# Patient Record
Sex: Male | Born: 1970 | Race: White | Hispanic: No | Marital: Married | Smoking: Never smoker
Health system: Southern US, Community
[De-identification: ages and names within clinical notes are randomized; demographics above are authoritative.]

## PROBLEM LIST (undated history)

## (undated) DIAGNOSIS — K222 Esophageal obstruction: Secondary | ICD-10-CM

## (undated) DIAGNOSIS — K219 Gastro-esophageal reflux disease without esophagitis: Secondary | ICD-10-CM

## (undated) HISTORY — DX: Esophageal obstruction: K22.2

## (undated) HISTORY — PX: WISDOM TOOTH EXTRACTION: SHX21

## (undated) HISTORY — DX: Gastro-esophageal reflux disease without esophagitis: K21.9

---

## 2004-11-15 ENCOUNTER — Ambulatory Visit: Payer: Self-pay | Admitting: Internal Medicine

## 2004-11-24 ENCOUNTER — Ambulatory Visit: Payer: Self-pay | Admitting: Internal Medicine

## 2004-12-06 ENCOUNTER — Ambulatory Visit: Payer: Self-pay | Admitting: Internal Medicine

## 2004-12-21 ENCOUNTER — Ambulatory Visit: Payer: Self-pay | Admitting: Internal Medicine

## 2005-01-02 ENCOUNTER — Ambulatory Visit: Payer: Self-pay | Admitting: Internal Medicine

## 2005-01-12 ENCOUNTER — Ambulatory Visit: Payer: Self-pay | Admitting: Internal Medicine

## 2005-01-18 ENCOUNTER — Ambulatory Visit: Payer: Self-pay | Admitting: Internal Medicine

## 2005-01-19 ENCOUNTER — Ambulatory Visit: Payer: Self-pay | Admitting: Internal Medicine

## 2005-01-26 ENCOUNTER — Ambulatory Visit: Payer: Self-pay | Admitting: Internal Medicine

## 2005-02-02 ENCOUNTER — Ambulatory Visit: Payer: Self-pay | Admitting: Internal Medicine

## 2005-02-07 ENCOUNTER — Ambulatory Visit: Payer: Self-pay | Admitting: Internal Medicine

## 2005-02-14 ENCOUNTER — Ambulatory Visit: Payer: Self-pay | Admitting: Internal Medicine

## 2005-02-23 ENCOUNTER — Ambulatory Visit: Payer: Self-pay | Admitting: Internal Medicine

## 2005-03-01 ENCOUNTER — Ambulatory Visit: Payer: Self-pay | Admitting: Internal Medicine

## 2005-03-07 ENCOUNTER — Ambulatory Visit: Payer: Self-pay | Admitting: Internal Medicine

## 2005-03-21 ENCOUNTER — Ambulatory Visit: Payer: Self-pay | Admitting: Internal Medicine

## 2005-03-27 ENCOUNTER — Ambulatory Visit: Payer: Self-pay | Admitting: Internal Medicine

## 2005-04-06 ENCOUNTER — Ambulatory Visit: Payer: Self-pay | Admitting: Internal Medicine

## 2005-04-20 ENCOUNTER — Ambulatory Visit: Payer: Self-pay | Admitting: Internal Medicine

## 2005-05-02 ENCOUNTER — Ambulatory Visit: Payer: Self-pay | Admitting: Internal Medicine

## 2005-05-16 ENCOUNTER — Ambulatory Visit: Payer: Self-pay | Admitting: Internal Medicine

## 2005-05-24 ENCOUNTER — Ambulatory Visit: Payer: Self-pay | Admitting: Internal Medicine

## 2005-06-05 ENCOUNTER — Ambulatory Visit: Payer: Self-pay | Admitting: Internal Medicine

## 2005-06-19 ENCOUNTER — Ambulatory Visit: Payer: Self-pay | Admitting: Internal Medicine

## 2005-07-06 ENCOUNTER — Ambulatory Visit: Payer: Self-pay | Admitting: Internal Medicine

## 2005-07-19 ENCOUNTER — Ambulatory Visit: Payer: Self-pay | Admitting: Internal Medicine

## 2005-07-25 ENCOUNTER — Ambulatory Visit: Payer: Self-pay | Admitting: Internal Medicine

## 2005-08-03 ENCOUNTER — Ambulatory Visit: Payer: Self-pay | Admitting: Internal Medicine

## 2005-08-08 ENCOUNTER — Ambulatory Visit: Payer: Self-pay | Admitting: Internal Medicine

## 2005-08-13 ENCOUNTER — Ambulatory Visit: Payer: Self-pay | Admitting: Internal Medicine

## 2005-08-16 ENCOUNTER — Ambulatory Visit: Payer: Self-pay | Admitting: Internal Medicine

## 2005-08-22 ENCOUNTER — Ambulatory Visit: Payer: Self-pay | Admitting: Internal Medicine

## 2005-09-07 ENCOUNTER — Ambulatory Visit: Payer: Self-pay | Admitting: Internal Medicine

## 2005-10-03 ENCOUNTER — Ambulatory Visit: Payer: Self-pay | Admitting: Internal Medicine

## 2005-10-19 ENCOUNTER — Ambulatory Visit: Payer: Self-pay | Admitting: Internal Medicine

## 2005-11-07 ENCOUNTER — Ambulatory Visit: Payer: Self-pay | Admitting: Internal Medicine

## 2005-11-15 ENCOUNTER — Ambulatory Visit: Payer: Self-pay | Admitting: Internal Medicine

## 2005-11-28 ENCOUNTER — Ambulatory Visit: Payer: Self-pay | Admitting: Internal Medicine

## 2005-12-12 ENCOUNTER — Ambulatory Visit: Payer: Self-pay | Admitting: Internal Medicine

## 2005-12-27 ENCOUNTER — Ambulatory Visit: Payer: Self-pay | Admitting: Internal Medicine

## 2006-01-10 ENCOUNTER — Ambulatory Visit: Payer: Self-pay | Admitting: Internal Medicine

## 2006-01-22 ENCOUNTER — Ambulatory Visit: Payer: Self-pay | Admitting: Internal Medicine

## 2006-02-05 ENCOUNTER — Ambulatory Visit: Payer: Self-pay | Admitting: Internal Medicine

## 2006-02-21 ENCOUNTER — Ambulatory Visit: Payer: Self-pay | Admitting: Internal Medicine

## 2006-03-13 ENCOUNTER — Ambulatory Visit: Payer: Self-pay | Admitting: Internal Medicine

## 2006-03-21 ENCOUNTER — Ambulatory Visit: Payer: Self-pay | Admitting: Internal Medicine

## 2006-03-28 ENCOUNTER — Ambulatory Visit: Payer: Self-pay | Admitting: Internal Medicine

## 2006-03-29 ENCOUNTER — Ambulatory Visit: Payer: Self-pay | Admitting: Internal Medicine

## 2006-04-10 ENCOUNTER — Ambulatory Visit: Payer: Self-pay | Admitting: Internal Medicine

## 2006-04-23 ENCOUNTER — Ambulatory Visit: Payer: Self-pay | Admitting: Internal Medicine

## 2006-05-03 ENCOUNTER — Ambulatory Visit: Payer: Self-pay | Admitting: Internal Medicine

## 2006-05-10 ENCOUNTER — Ambulatory Visit: Payer: Self-pay | Admitting: Internal Medicine

## 2006-05-23 ENCOUNTER — Ambulatory Visit: Payer: Self-pay | Admitting: Internal Medicine

## 2006-05-31 ENCOUNTER — Ambulatory Visit: Payer: Self-pay | Admitting: Internal Medicine

## 2006-06-07 ENCOUNTER — Ambulatory Visit: Payer: Self-pay | Admitting: Internal Medicine

## 2006-06-10 ENCOUNTER — Encounter: Admission: RE | Admit: 2006-06-10 | Discharge: 2006-06-10 | Payer: Self-pay | Admitting: Orthopedic Surgery

## 2006-06-14 ENCOUNTER — Ambulatory Visit: Payer: Self-pay | Admitting: Internal Medicine

## 2006-06-28 ENCOUNTER — Ambulatory Visit: Payer: Self-pay | Admitting: Internal Medicine

## 2006-07-05 ENCOUNTER — Ambulatory Visit: Payer: Self-pay | Admitting: Internal Medicine

## 2006-07-15 ENCOUNTER — Ambulatory Visit: Payer: Self-pay | Admitting: Internal Medicine

## 2006-07-26 ENCOUNTER — Ambulatory Visit: Payer: Self-pay | Admitting: Internal Medicine

## 2006-07-30 ENCOUNTER — Ambulatory Visit: Payer: Self-pay | Admitting: Internal Medicine

## 2006-08-27 ENCOUNTER — Ambulatory Visit: Payer: Self-pay | Admitting: Internal Medicine

## 2006-09-05 ENCOUNTER — Ambulatory Visit: Payer: Self-pay | Admitting: Internal Medicine

## 2006-09-25 ENCOUNTER — Ambulatory Visit: Payer: Self-pay | Admitting: Internal Medicine

## 2006-09-30 ENCOUNTER — Ambulatory Visit: Payer: Self-pay | Admitting: Internal Medicine

## 2006-10-25 ENCOUNTER — Ambulatory Visit: Payer: Self-pay | Admitting: Internal Medicine

## 2006-11-08 ENCOUNTER — Ambulatory Visit: Payer: Self-pay | Admitting: Internal Medicine

## 2006-11-28 ENCOUNTER — Ambulatory Visit: Payer: Self-pay | Admitting: Internal Medicine

## 2006-12-13 ENCOUNTER — Ambulatory Visit: Payer: Self-pay | Admitting: Internal Medicine

## 2006-12-30 ENCOUNTER — Ambulatory Visit: Payer: Self-pay | Admitting: Internal Medicine

## 2007-01-15 ENCOUNTER — Ambulatory Visit: Payer: Self-pay | Admitting: Internal Medicine

## 2007-01-23 ENCOUNTER — Ambulatory Visit: Payer: Self-pay | Admitting: Internal Medicine

## 2007-01-30 ENCOUNTER — Ambulatory Visit: Payer: Self-pay | Admitting: Internal Medicine

## 2007-02-07 ENCOUNTER — Ambulatory Visit: Payer: Self-pay | Admitting: Internal Medicine

## 2007-02-07 LAB — CONVERTED CEMR LAB
ALT: 20 units/L (ref 0–40)
AST: 18 units/L (ref 0–37)
Basophils Relative: 0.2 % (ref 0.0–1.0)
Bilirubin, Direct: 0.1 mg/dL (ref 0.0–0.3)
CO2: 29 meq/L (ref 19–32)
Chloride: 110 meq/L (ref 96–112)
Eosinophils Absolute: 0.3 10*3/uL (ref 0.0–0.6)
Eosinophils Relative: 4.5 % (ref 0.0–5.0)
GFR calc non Af Amer: 103 mL/min
Glucose, Bld: 87 mg/dL (ref 70–99)
HCT: 46.2 % (ref 39.0–52.0)
Lymphocytes Relative: 35.6 % (ref 12.0–46.0)
MCV: 83.7 fL (ref 78.0–100.0)
Neutrophils Relative %: 48.1 % (ref 43.0–77.0)
RBC: 5.52 M/uL (ref 4.22–5.81)
Sodium: 143 meq/L (ref 135–145)
TSH: 2.51 microintl units/mL (ref 0.35–5.50)
Total Bilirubin: 0.7 mg/dL (ref 0.3–1.2)
Total CHOL/HDL Ratio: 5.2
Total Protein: 6.2 g/dL (ref 6.0–8.3)
VLDL: 25 mg/dL (ref 0–40)
WBC: 7.2 10*3/uL (ref 4.5–10.5)

## 2007-02-11 ENCOUNTER — Ambulatory Visit: Payer: Self-pay | Admitting: Internal Medicine

## 2007-02-14 ENCOUNTER — Ambulatory Visit: Payer: Self-pay | Admitting: Internal Medicine

## 2007-02-18 ENCOUNTER — Ambulatory Visit: Payer: Self-pay | Admitting: Internal Medicine

## 2007-02-27 ENCOUNTER — Ambulatory Visit: Payer: Self-pay | Admitting: Internal Medicine

## 2007-03-05 ENCOUNTER — Ambulatory Visit: Payer: Self-pay | Admitting: Internal Medicine

## 2007-03-14 ENCOUNTER — Ambulatory Visit: Payer: Self-pay | Admitting: Internal Medicine

## 2007-03-24 ENCOUNTER — Ambulatory Visit: Payer: Self-pay | Admitting: Internal Medicine

## 2007-04-04 ENCOUNTER — Ambulatory Visit: Payer: Self-pay | Admitting: Internal Medicine

## 2007-04-11 ENCOUNTER — Ambulatory Visit: Payer: Self-pay | Admitting: Internal Medicine

## 2007-04-18 ENCOUNTER — Ambulatory Visit: Payer: Self-pay | Admitting: Internal Medicine

## 2007-04-29 ENCOUNTER — Ambulatory Visit: Payer: Self-pay | Admitting: Internal Medicine

## 2007-05-07 ENCOUNTER — Ambulatory Visit: Payer: Self-pay | Admitting: Internal Medicine

## 2007-05-12 ENCOUNTER — Ambulatory Visit: Payer: Self-pay | Admitting: Internal Medicine

## 2007-05-14 ENCOUNTER — Ambulatory Visit: Payer: Self-pay | Admitting: Internal Medicine

## 2007-05-30 ENCOUNTER — Ambulatory Visit: Payer: Self-pay | Admitting: Internal Medicine

## 2007-06-17 ENCOUNTER — Ambulatory Visit: Payer: Self-pay | Admitting: Internal Medicine

## 2007-06-26 ENCOUNTER — Ambulatory Visit: Payer: Self-pay | Admitting: Internal Medicine

## 2007-07-03 DIAGNOSIS — J309 Allergic rhinitis, unspecified: Secondary | ICD-10-CM | POA: Insufficient documentation

## 2007-07-10 ENCOUNTER — Ambulatory Visit: Payer: Self-pay | Admitting: Internal Medicine

## 2007-07-18 ENCOUNTER — Ambulatory Visit: Payer: Self-pay | Admitting: Internal Medicine

## 2007-07-28 ENCOUNTER — Ambulatory Visit: Payer: Self-pay | Admitting: Internal Medicine

## 2007-08-05 ENCOUNTER — Ambulatory Visit: Payer: Self-pay | Admitting: Internal Medicine

## 2007-08-13 ENCOUNTER — Ambulatory Visit: Payer: Self-pay | Admitting: Internal Medicine

## 2007-08-26 ENCOUNTER — Ambulatory Visit: Payer: Self-pay | Admitting: Internal Medicine

## 2007-09-03 ENCOUNTER — Ambulatory Visit: Payer: Self-pay | Admitting: Internal Medicine

## 2007-09-12 ENCOUNTER — Ambulatory Visit: Payer: Self-pay | Admitting: Internal Medicine

## 2007-09-24 ENCOUNTER — Ambulatory Visit: Payer: Self-pay | Admitting: Internal Medicine

## 2007-09-25 ENCOUNTER — Ambulatory Visit: Payer: Self-pay | Admitting: Internal Medicine

## 2007-10-10 ENCOUNTER — Ambulatory Visit: Payer: Self-pay | Admitting: Internal Medicine

## 2007-10-28 ENCOUNTER — Ambulatory Visit: Payer: Self-pay | Admitting: Internal Medicine

## 2007-11-10 ENCOUNTER — Ambulatory Visit: Payer: Self-pay | Admitting: Internal Medicine

## 2007-11-28 ENCOUNTER — Ambulatory Visit: Payer: Self-pay | Admitting: Internal Medicine

## 2007-12-12 ENCOUNTER — Ambulatory Visit: Payer: Self-pay | Admitting: Internal Medicine

## 2007-12-22 ENCOUNTER — Ambulatory Visit: Payer: Self-pay | Admitting: Internal Medicine

## 2007-12-30 ENCOUNTER — Ambulatory Visit: Payer: Self-pay | Admitting: Internal Medicine

## 2008-01-06 ENCOUNTER — Ambulatory Visit: Payer: Self-pay | Admitting: Gastroenterology

## 2008-01-06 ENCOUNTER — Ambulatory Visit: Payer: Self-pay | Admitting: Internal Medicine

## 2008-01-15 ENCOUNTER — Ambulatory Visit: Payer: Self-pay | Admitting: Internal Medicine

## 2008-01-22 ENCOUNTER — Encounter: Payer: Self-pay | Admitting: Gastroenterology

## 2008-01-22 ENCOUNTER — Ambulatory Visit: Payer: Self-pay | Admitting: Gastroenterology

## 2008-01-23 ENCOUNTER — Encounter: Payer: Self-pay | Admitting: Internal Medicine

## 2008-01-23 ENCOUNTER — Ambulatory Visit: Payer: Self-pay | Admitting: Internal Medicine

## 2008-01-27 ENCOUNTER — Ambulatory Visit: Payer: Self-pay | Admitting: Internal Medicine

## 2008-02-06 ENCOUNTER — Ambulatory Visit: Payer: Self-pay | Admitting: Internal Medicine

## 2008-02-12 ENCOUNTER — Ambulatory Visit: Payer: Self-pay | Admitting: Internal Medicine

## 2008-02-23 ENCOUNTER — Ambulatory Visit: Payer: Self-pay | Admitting: Internal Medicine

## 2008-03-01 ENCOUNTER — Ambulatory Visit: Payer: Self-pay | Admitting: Gastroenterology

## 2008-03-03 ENCOUNTER — Ambulatory Visit: Payer: Self-pay | Admitting: Internal Medicine

## 2008-03-04 ENCOUNTER — Ambulatory Visit: Payer: Self-pay | Admitting: Internal Medicine

## 2008-03-09 ENCOUNTER — Ambulatory Visit: Payer: Self-pay | Admitting: Internal Medicine

## 2008-03-17 ENCOUNTER — Ambulatory Visit: Payer: Self-pay | Admitting: Internal Medicine

## 2008-04-01 ENCOUNTER — Ambulatory Visit: Payer: Self-pay | Admitting: Internal Medicine

## 2008-04-07 ENCOUNTER — Ambulatory Visit: Payer: Self-pay | Admitting: Internal Medicine

## 2008-04-19 ENCOUNTER — Ambulatory Visit: Payer: Self-pay | Admitting: Internal Medicine

## 2008-04-28 ENCOUNTER — Ambulatory Visit: Payer: Self-pay | Admitting: Internal Medicine

## 2008-05-06 ENCOUNTER — Ambulatory Visit: Payer: Self-pay | Admitting: Internal Medicine

## 2008-05-19 ENCOUNTER — Ambulatory Visit: Payer: Self-pay | Admitting: Internal Medicine

## 2008-05-31 ENCOUNTER — Ambulatory Visit: Payer: Self-pay | Admitting: Internal Medicine

## 2008-06-10 ENCOUNTER — Ambulatory Visit: Payer: Self-pay | Admitting: Internal Medicine

## 2008-06-29 ENCOUNTER — Ambulatory Visit: Payer: Self-pay | Admitting: Internal Medicine

## 2008-07-09 ENCOUNTER — Ambulatory Visit: Payer: Self-pay | Admitting: Internal Medicine

## 2008-07-20 ENCOUNTER — Ambulatory Visit: Payer: Self-pay | Admitting: Internal Medicine

## 2008-08-05 ENCOUNTER — Ambulatory Visit: Payer: Self-pay | Admitting: Internal Medicine

## 2008-08-20 ENCOUNTER — Ambulatory Visit: Payer: Self-pay | Admitting: Internal Medicine

## 2008-08-24 ENCOUNTER — Ambulatory Visit: Payer: Self-pay | Admitting: Internal Medicine

## 2008-08-24 ENCOUNTER — Ambulatory Visit: Payer: Self-pay | Admitting: Pulmonary Disease

## 2008-08-27 ENCOUNTER — Ambulatory Visit: Payer: Self-pay | Admitting: Internal Medicine

## 2008-09-02 ENCOUNTER — Ambulatory Visit: Payer: Self-pay | Admitting: Gastroenterology

## 2008-09-02 ENCOUNTER — Ambulatory Visit: Payer: Self-pay | Admitting: Internal Medicine

## 2008-09-02 DIAGNOSIS — R1319 Other dysphagia: Secondary | ICD-10-CM | POA: Insufficient documentation

## 2008-09-02 DIAGNOSIS — K219 Gastro-esophageal reflux disease without esophagitis: Secondary | ICD-10-CM | POA: Insufficient documentation

## 2008-09-10 ENCOUNTER — Ambulatory Visit: Payer: Self-pay | Admitting: Internal Medicine

## 2008-09-17 ENCOUNTER — Ambulatory Visit: Payer: Self-pay | Admitting: Internal Medicine

## 2008-09-28 ENCOUNTER — Ambulatory Visit: Payer: Self-pay | Admitting: Internal Medicine

## 2008-10-05 ENCOUNTER — Ambulatory Visit: Payer: Self-pay | Admitting: Gastroenterology

## 2008-10-05 ENCOUNTER — Ambulatory Visit: Payer: Self-pay | Admitting: Internal Medicine

## 2008-10-08 ENCOUNTER — Ambulatory Visit: Payer: Self-pay | Admitting: Internal Medicine

## 2008-10-12 ENCOUNTER — Ambulatory Visit: Payer: Self-pay | Admitting: Gastroenterology

## 2008-11-03 ENCOUNTER — Ambulatory Visit: Payer: Self-pay | Admitting: Internal Medicine

## 2008-11-09 ENCOUNTER — Ambulatory Visit: Payer: Self-pay | Admitting: Internal Medicine

## 2008-11-29 ENCOUNTER — Ambulatory Visit: Payer: Self-pay | Admitting: Internal Medicine

## 2008-12-09 ENCOUNTER — Ambulatory Visit: Payer: Self-pay | Admitting: Internal Medicine

## 2008-12-22 ENCOUNTER — Ambulatory Visit: Payer: Self-pay | Admitting: Internal Medicine

## 2008-12-31 ENCOUNTER — Ambulatory Visit: Payer: Self-pay | Admitting: Internal Medicine

## 2009-01-07 ENCOUNTER — Ambulatory Visit: Payer: Self-pay | Admitting: Internal Medicine

## 2009-01-14 ENCOUNTER — Ambulatory Visit: Payer: Self-pay | Admitting: Internal Medicine

## 2009-01-21 ENCOUNTER — Ambulatory Visit: Payer: Self-pay | Admitting: Internal Medicine

## 2009-02-03 ENCOUNTER — Ambulatory Visit: Payer: Self-pay | Admitting: Internal Medicine

## 2009-02-11 ENCOUNTER — Ambulatory Visit: Payer: Self-pay | Admitting: Internal Medicine

## 2009-02-18 ENCOUNTER — Ambulatory Visit: Payer: Self-pay | Admitting: Internal Medicine

## 2009-03-09 ENCOUNTER — Encounter: Payer: Self-pay | Admitting: Internal Medicine

## 2009-04-22 ENCOUNTER — Telehealth (INDEPENDENT_AMBULATORY_CARE_PROVIDER_SITE_OTHER): Payer: Self-pay | Admitting: *Deleted

## 2009-04-25 ENCOUNTER — Ambulatory Visit: Payer: Self-pay | Admitting: Internal Medicine

## 2009-05-02 ENCOUNTER — Ambulatory Visit: Payer: Self-pay | Admitting: Internal Medicine

## 2009-05-03 ENCOUNTER — Telehealth: Payer: Self-pay | Admitting: Internal Medicine

## 2009-05-03 ENCOUNTER — Ambulatory Visit: Payer: Self-pay | Admitting: Internal Medicine

## 2009-05-31 ENCOUNTER — Ambulatory Visit: Payer: Self-pay | Admitting: Internal Medicine

## 2009-06-03 ENCOUNTER — Ambulatory Visit: Payer: Self-pay | Admitting: Internal Medicine

## 2009-06-07 ENCOUNTER — Ambulatory Visit: Payer: Self-pay | Admitting: Internal Medicine

## 2009-06-10 ENCOUNTER — Ambulatory Visit: Payer: Self-pay | Admitting: Internal Medicine

## 2009-06-14 ENCOUNTER — Ambulatory Visit: Payer: Self-pay | Admitting: Internal Medicine

## 2009-06-17 ENCOUNTER — Ambulatory Visit: Payer: Self-pay | Admitting: Internal Medicine

## 2009-06-21 ENCOUNTER — Ambulatory Visit: Payer: Self-pay | Admitting: Internal Medicine

## 2009-06-27 ENCOUNTER — Ambulatory Visit: Payer: Self-pay | Admitting: Internal Medicine

## 2009-06-30 ENCOUNTER — Ambulatory Visit: Payer: Self-pay | Admitting: Internal Medicine

## 2009-07-01 ENCOUNTER — Ambulatory Visit: Payer: Self-pay | Admitting: Internal Medicine

## 2009-07-05 ENCOUNTER — Ambulatory Visit: Payer: Self-pay | Admitting: Internal Medicine

## 2009-07-08 ENCOUNTER — Ambulatory Visit: Payer: Self-pay | Admitting: Internal Medicine

## 2009-07-12 ENCOUNTER — Ambulatory Visit: Payer: Self-pay | Admitting: Internal Medicine

## 2009-07-15 ENCOUNTER — Ambulatory Visit: Payer: Self-pay | Admitting: Internal Medicine

## 2009-07-19 ENCOUNTER — Ambulatory Visit: Payer: Self-pay | Admitting: Internal Medicine

## 2009-07-22 ENCOUNTER — Ambulatory Visit: Payer: Self-pay | Admitting: Internal Medicine

## 2009-07-26 ENCOUNTER — Ambulatory Visit: Payer: Self-pay | Admitting: Internal Medicine

## 2009-08-01 ENCOUNTER — Ambulatory Visit: Payer: Self-pay | Admitting: Internal Medicine

## 2009-08-01 ENCOUNTER — Encounter (INDEPENDENT_AMBULATORY_CARE_PROVIDER_SITE_OTHER): Payer: Self-pay | Admitting: *Deleted

## 2009-08-04 ENCOUNTER — Ambulatory Visit: Payer: Self-pay | Admitting: Internal Medicine

## 2009-08-05 ENCOUNTER — Ambulatory Visit: Payer: Self-pay | Admitting: Internal Medicine

## 2009-08-09 ENCOUNTER — Ambulatory Visit: Payer: Self-pay | Admitting: Internal Medicine

## 2009-08-11 ENCOUNTER — Ambulatory Visit: Payer: Self-pay | Admitting: Internal Medicine

## 2009-08-15 ENCOUNTER — Ambulatory Visit: Payer: Self-pay | Admitting: Internal Medicine

## 2009-08-19 ENCOUNTER — Ambulatory Visit: Payer: Self-pay | Admitting: Internal Medicine

## 2009-08-23 ENCOUNTER — Ambulatory Visit: Payer: Self-pay | Admitting: Internal Medicine

## 2009-08-31 ENCOUNTER — Ambulatory Visit: Payer: Self-pay | Admitting: Internal Medicine

## 2009-09-02 ENCOUNTER — Ambulatory Visit: Payer: Self-pay | Admitting: Internal Medicine

## 2009-09-07 ENCOUNTER — Ambulatory Visit: Payer: Self-pay | Admitting: Internal Medicine

## 2009-09-09 ENCOUNTER — Ambulatory Visit: Payer: Self-pay | Admitting: Internal Medicine

## 2009-09-13 ENCOUNTER — Ambulatory Visit: Payer: Self-pay | Admitting: Internal Medicine

## 2009-09-20 ENCOUNTER — Ambulatory Visit: Payer: Self-pay | Admitting: Internal Medicine

## 2009-09-21 ENCOUNTER — Ambulatory Visit: Payer: Self-pay | Admitting: Internal Medicine

## 2009-09-22 ENCOUNTER — Ambulatory Visit: Payer: Self-pay | Admitting: Internal Medicine

## 2009-10-04 ENCOUNTER — Ambulatory Visit: Payer: Self-pay | Admitting: Internal Medicine

## 2009-10-10 ENCOUNTER — Ambulatory Visit: Payer: Self-pay | Admitting: Internal Medicine

## 2009-10-14 ENCOUNTER — Ambulatory Visit: Payer: Self-pay | Admitting: Internal Medicine

## 2009-10-19 ENCOUNTER — Ambulatory Visit: Payer: Self-pay | Admitting: Internal Medicine

## 2009-10-27 ENCOUNTER — Ambulatory Visit: Payer: Self-pay | Admitting: Internal Medicine

## 2009-11-01 ENCOUNTER — Ambulatory Visit: Payer: Self-pay | Admitting: Internal Medicine

## 2009-11-09 ENCOUNTER — Ambulatory Visit: Payer: Self-pay | Admitting: Internal Medicine

## 2009-11-17 ENCOUNTER — Ambulatory Visit: Payer: Self-pay | Admitting: Internal Medicine

## 2009-11-30 ENCOUNTER — Ambulatory Visit: Payer: Self-pay | Admitting: Internal Medicine

## 2009-12-08 ENCOUNTER — Ambulatory Visit: Payer: Self-pay | Admitting: Internal Medicine

## 2009-12-14 ENCOUNTER — Ambulatory Visit: Payer: Self-pay | Admitting: Internal Medicine

## 2009-12-22 ENCOUNTER — Ambulatory Visit: Payer: Self-pay | Admitting: Internal Medicine

## 2009-12-29 ENCOUNTER — Ambulatory Visit: Payer: Self-pay | Admitting: Internal Medicine

## 2010-01-05 ENCOUNTER — Ambulatory Visit: Payer: Self-pay | Admitting: Internal Medicine

## 2010-01-12 ENCOUNTER — Ambulatory Visit: Payer: Self-pay | Admitting: Internal Medicine

## 2010-01-12 ENCOUNTER — Telehealth (INDEPENDENT_AMBULATORY_CARE_PROVIDER_SITE_OTHER): Payer: Self-pay | Admitting: *Deleted

## 2010-01-13 ENCOUNTER — Ambulatory Visit: Payer: Self-pay | Admitting: Internal Medicine

## 2010-01-18 ENCOUNTER — Ambulatory Visit: Payer: Self-pay | Admitting: Internal Medicine

## 2010-01-26 ENCOUNTER — Ambulatory Visit: Payer: Self-pay | Admitting: Internal Medicine

## 2010-02-02 ENCOUNTER — Ambulatory Visit: Payer: Self-pay | Admitting: Internal Medicine

## 2010-02-03 ENCOUNTER — Ambulatory Visit: Payer: Self-pay | Admitting: Internal Medicine

## 2010-02-09 ENCOUNTER — Ambulatory Visit: Payer: Self-pay | Admitting: Internal Medicine

## 2010-02-17 ENCOUNTER — Ambulatory Visit: Payer: Self-pay | Admitting: Internal Medicine

## 2010-02-22 ENCOUNTER — Ambulatory Visit: Payer: Self-pay | Admitting: Internal Medicine

## 2010-03-01 ENCOUNTER — Ambulatory Visit: Payer: Self-pay | Admitting: Internal Medicine

## 2010-03-10 ENCOUNTER — Ambulatory Visit: Payer: Self-pay | Admitting: Internal Medicine

## 2010-03-15 ENCOUNTER — Ambulatory Visit: Payer: Self-pay | Admitting: Internal Medicine

## 2010-03-24 ENCOUNTER — Ambulatory Visit: Payer: Self-pay | Admitting: Internal Medicine

## 2010-03-30 ENCOUNTER — Ambulatory Visit: Payer: Self-pay | Admitting: Internal Medicine

## 2010-04-06 ENCOUNTER — Ambulatory Visit: Payer: Self-pay | Admitting: Internal Medicine

## 2010-04-12 ENCOUNTER — Ambulatory Visit: Payer: Self-pay | Admitting: Internal Medicine

## 2010-04-19 ENCOUNTER — Ambulatory Visit: Payer: Self-pay | Admitting: Internal Medicine

## 2010-05-03 ENCOUNTER — Telehealth (INDEPENDENT_AMBULATORY_CARE_PROVIDER_SITE_OTHER): Payer: Self-pay | Admitting: *Deleted

## 2010-05-04 ENCOUNTER — Ambulatory Visit: Payer: Self-pay | Admitting: Internal Medicine

## 2010-05-19 ENCOUNTER — Ambulatory Visit: Payer: Self-pay | Admitting: Internal Medicine

## 2010-05-31 ENCOUNTER — Ambulatory Visit: Payer: Self-pay | Admitting: Internal Medicine

## 2010-06-14 ENCOUNTER — Ambulatory Visit: Payer: Self-pay | Admitting: Internal Medicine

## 2010-06-23 ENCOUNTER — Ambulatory Visit: Payer: Self-pay | Admitting: Internal Medicine

## 2010-06-30 ENCOUNTER — Ambulatory Visit: Payer: Self-pay | Admitting: Internal Medicine

## 2010-07-03 ENCOUNTER — Ambulatory Visit: Payer: Self-pay | Admitting: Internal Medicine

## 2010-07-07 ENCOUNTER — Ambulatory Visit: Payer: Self-pay | Admitting: Internal Medicine

## 2010-07-19 ENCOUNTER — Ambulatory Visit: Payer: Self-pay | Admitting: Internal Medicine

## 2010-07-28 ENCOUNTER — Ambulatory Visit: Payer: Self-pay | Admitting: Internal Medicine

## 2010-08-03 ENCOUNTER — Ambulatory Visit: Payer: Self-pay | Admitting: Internal Medicine

## 2010-08-10 ENCOUNTER — Ambulatory Visit: Payer: Self-pay | Admitting: Internal Medicine

## 2010-08-17 ENCOUNTER — Ambulatory Visit: Payer: Self-pay | Admitting: Internal Medicine

## 2010-08-25 ENCOUNTER — Ambulatory Visit: Payer: Self-pay | Admitting: Internal Medicine

## 2010-09-11 ENCOUNTER — Ambulatory Visit: Payer: Self-pay | Admitting: Internal Medicine

## 2010-09-26 ENCOUNTER — Ambulatory Visit: Payer: Self-pay | Admitting: Internal Medicine

## 2010-10-17 ENCOUNTER — Ambulatory Visit: Payer: Self-pay | Admitting: Internal Medicine

## 2010-11-03 ENCOUNTER — Ambulatory Visit: Payer: Self-pay | Admitting: Internal Medicine

## 2010-11-17 ENCOUNTER — Encounter: Payer: Self-pay | Admitting: Internal Medicine

## 2010-11-24 ENCOUNTER — Ambulatory Visit: Payer: Self-pay | Admitting: Internal Medicine

## 2010-12-03 ENCOUNTER — Ambulatory Visit: Payer: Self-pay | Admitting: Internal Medicine

## 2010-12-05 NOTE — Miscellaneous (Signed)
Summary: Injection Orders / [facility]    Injection Orders / [facility]    Imported By: Lennie Odor 04/04/2010 15:48:45  _____________________________________________________________________  External Attachment:    Type:   Image     Comment:   External Document

## 2010-12-05 NOTE — Miscellaneous (Signed)
Summary: Injection Record / [facility]    Injection Record /  Allergy    Imported By: Lennie Odor 07/07/2010 12:19:31  _____________________________________________________________________  External Attachment:    Type:   Image     Comment:   External Document

## 2010-12-05 NOTE — Progress Notes (Signed)
  Phone Note Other Incoming   Request: Send information Summary of Call:  medical release form received requesting that records be sent to the patient. Fee schedule faxed to the patient at [phone]. Request forwarded to [employer].

## 2010-12-05 NOTE — Progress Notes (Signed)
Summary: sinus problem  Phone Note Call from Patient   Caller: Patient Call For: young Summary of Call: pt have sinus problem . would like nasal spray [facility]  Initial call taken by: Rickard Patience,  January 12, 2010 4:01 PM  Follow-up for Phone Call        Pt last seen in June 2010 and was advised by Dr Maple Hudson to followup in 4 months.  Pt never sched appt, so advised that he come in for eval.  OV with CDY sched for tommorrow am at 10:45 am. Follow-up by: Vernie Murders,  January 12, 2010 4:08 PM

## 2010-12-05 NOTE — Assessment & Plan Note (Signed)
Summary: sinus problems//lmr   Primary Provider/Referring Provider:  n/a  CC:  Accute Visit-allergy trouble with sinus since start of allergy season.Marland Kitchen  History of Present Illness:  11/09/08- allergic rhinitis, GERD/ stricture Some stuffy nose with winter heat. Out of Flonase and has not been needing Xyzal. Has saline lavage squeeze bottle. OK with allergy vaccine. Discussed H1N1.  04/25/09- Allergic rhinitis, GERD/ stricture He finally makes it back with plan to retest. He has been off allergy vaccine for past month and doing ok but was uncomfortable with Spring tree pollens. Nasal congestion and eye irritiation were a bigger problem than chest congestion or wheeze.  Skin Test- Strong positives grass, weeds, trees, mite/ cockroach. Only did puncture tests.  January 13, 2010- Allergic rhinitis, GERD/ stricture Continues allergy vaccine. Comes for yearly update. Notes early seasonal nasal congestion. Has Margarito Dehaas daughter now and we discussed viral infections.  Denies chest tightness, wheeze, palpitation, nausea, heartburn, rash.      Current Medications (verified): 1)  Flonase 50 Mcg/act  Susp (Fluticasone Propionate) .... As Needed 2)  Xyzal 5 Mg  Tabs (Levocetirizine Dihydrochloride) .... Take 1 Tablet By Mouth Once A Day As Needed 3)  Allergy Vaccine Gh 1:10 .... Changed To New Mix 4)  Allergy Vaccine  Restart New Mix 5)  Omeprazole 20 Mg Cpdr (Omeprazole) .... 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   Chews Tobacco Alcohol Use - no Illicit Drug Use - no  Risk Factors: Smoking Status: never (11/10/2007)  Review of Systems      See HPI  The patient denies anorexia, fever, weight loss, weight gain, vision loss, decreased hearing, hoarseness, chest pain, syncope, dyspnea on exertion, peripheral edema, prolonged cough, headaches, hemoptysis, and severe indigestion/heartburn.    Vital Signs:  Patient profile:   40 year old male Height:      69 inches Weight:      221.13 pounds BMI:     32.77 O2 Sat:      97 % on Room air Pulse rate:   72 / minute BP sitting:   122 / 80  (left arm) Cuff size:   regular  Vitals Entered By: Reynaldo Minium CMA (January 13, 2010 11:03 AM)  O2 Flow:  Room air  Physical Exam  Additional Exam:  General: A/Ox3; pleasant and cooperative, NAD, SKIN: cafe au lait spot right back NODES: no lymphadenopathy HEENT: [place]/AT, EOM- WNL, Conjuctivae- clear, PERRLA, TM-WNL, Nose- dry crusting, Throat- clear and wnl, Mallampati  III NECK: Supple w/ fair ROM, JVD- none, normal carotid impulses w/o bruits Thyroid- CHEST: Clear to P&A HEART: RRR, no m/g/r heard ABDOMEN: Soft and nl;  VHQ:IONG, nl pulses, no edema  NEURO: Grossly intact to observation      Impression & Recommendations:  Problem # 1:  ALLERGIC RHINITIS (ICD-477.9)  Minor Spring seasonal symptoms. We discussed alternative meds. He continues fluticasone and Xyzal. He continues allergy vaccine. His updated medication list for this problem includes:    Flonase 50 Mcg/act Susp (Fluticasone propionate) .Marland Kitchen... As needed    Xyzal 5 Mg Tabs (Levocetirizine dihydrochloride) .Marland Kitchen... Take 1 tablet by mouth  once a day as needed  Orders: Est. Patient Level III (40981)  Patient Instructions: 1)  Schedule return in one year, earlier if needed 2)  Try sample Astepro nasal antihistamine spray:  3)    1-2 puffs each nostril up to twice daily as needed. Call for script if helpful. Prescriptions: XYZAL 5  MG  TABS (LEVOCETIRIZINE DIHYDROCHLORIDE) Take 1 tablet by mouth once a day as needed  #30 x PRN   Entered and Authorized by:   Waymon Budge MD   Signed by:   Waymon Budge MD on 01/13/2010   Method used:   Electronically to        [facility]  [address] [phone]* (retail)       [address]       [place], [place]  [address]       Ph: [phone]       Fax: [phone]   RxID:   [id number] FLONASE 50 MCG/ACT  SUSP (FLUTICASONE PROPIONATE) as needed  #1 x prn   Entered and Authorized by:   Waymon Budge MD   Signed by:   Waymon Budge MD on 01/13/2010   Method used:   [facility] to        [facility]  [address] [phone]* (retail)       [address]       [place], [place]  [address]       Ph: [phone]       Fax: [phone]   RxID:   [id number]

## 2010-12-05 NOTE — Miscellaneous (Signed)
Summary: Injection Record/[facility]  Injection Record/[facility]   Imported By: Lanelle Bal 03/08/2010 13:49:23  _____________________________________________________________________  External Attachment:    Type:   Image     Comment:   External Document

## 2010-12-05 NOTE — Miscellaneous (Signed)
Summary: Injection Record/ Allergy  Injection Record/ Allergy   Imported By: Sherian Rein 03/09/2010 13:47:07  _____________________________________________________________________  External Attachment:    Type:   Image     Comment:   External Document

## 2010-12-19 ENCOUNTER — Ambulatory Visit (INDEPENDENT_AMBULATORY_CARE_PROVIDER_SITE_OTHER): Payer: BC Managed Care – PPO

## 2010-12-19 DIAGNOSIS — J301 Allergic rhinitis due to pollen: Secondary | ICD-10-CM

## 2010-12-27 NOTE — Miscellaneous (Signed)
Summary: Injection Financial risk [employer]   Imported By: Sherian Rein 12/21/2010 08:48:57  _____________________________________________________________________  External Attachment:    Type:   Image     Comment:   External Document

## 2011-01-03 ENCOUNTER — Ambulatory Visit (INDEPENDENT_AMBULATORY_CARE_PROVIDER_SITE_OTHER): Payer: BC Managed Care – PPO

## 2011-01-03 DIAGNOSIS — J301 Allergic rhinitis due to pollen: Secondary | ICD-10-CM

## 2011-01-12 ENCOUNTER — Ambulatory Visit (INDEPENDENT_AMBULATORY_CARE_PROVIDER_SITE_OTHER): Payer: BC Managed Care – PPO

## 2011-01-12 ENCOUNTER — Encounter: Payer: Self-pay | Admitting: Internal Medicine

## 2011-01-12 DIAGNOSIS — J301 Allergic rhinitis due to pollen: Secondary | ICD-10-CM

## 2011-01-15 ENCOUNTER — Ambulatory Visit (INDEPENDENT_AMBULATORY_CARE_PROVIDER_SITE_OTHER): Payer: BC Managed Care – PPO

## 2011-01-15 DIAGNOSIS — J301 Allergic rhinitis due to pollen: Secondary | ICD-10-CM

## 2011-01-16 ENCOUNTER — Encounter: Payer: Self-pay | Admitting: Internal Medicine

## 2011-01-16 NOTE — Assessment & Plan Note (Signed)
Summary: ALLERGY/CB   Nurse Visit   Allergies: No Known Drug Allergies  Orders Added: 1)  Allergy Injection (1) [95115] 

## 2011-01-18 ENCOUNTER — Encounter: Payer: Self-pay | Admitting: Internal Medicine

## 2011-01-18 ENCOUNTER — Ambulatory Visit (INDEPENDENT_AMBULATORY_CARE_PROVIDER_SITE_OTHER): Payer: BC Managed Care – PPO

## 2011-01-18 DIAGNOSIS — J301 Allergic rhinitis due to pollen: Secondary | ICD-10-CM

## 2011-01-23 NOTE — Assessment & Plan Note (Signed)
Summary: EXTRACT/10/CB  Nurse Visit   Allergies: No Known Drug Allergies  Orders Added: 1)  [facility],1 or multi ([facility]) [[address]165] 

## 2011-01-23 NOTE — Assessment & Plan Note (Signed)
Summary: allergy/cb  Nurse Visit   Allergies: No Known Drug Allergies  Orders Added: 1)  Allergy Injection (1) [95115] 

## 2011-01-25 ENCOUNTER — Ambulatory Visit (INDEPENDENT_AMBULATORY_CARE_PROVIDER_SITE_OTHER): Payer: BC Managed Care – PPO

## 2011-01-25 DIAGNOSIS — J301 Allergic rhinitis due to pollen: Secondary | ICD-10-CM

## 2011-02-01 ENCOUNTER — Ambulatory Visit (INDEPENDENT_AMBULATORY_CARE_PROVIDER_SITE_OTHER): Payer: BC Managed Care – PPO

## 2011-02-01 DIAGNOSIS — J301 Allergic rhinitis due to pollen: Secondary | ICD-10-CM

## 2011-02-13 ENCOUNTER — Ambulatory Visit (INDEPENDENT_AMBULATORY_CARE_PROVIDER_SITE_OTHER): Payer: BC Managed Care – PPO

## 2011-02-13 DIAGNOSIS — J301 Allergic rhinitis due to pollen: Secondary | ICD-10-CM

## 2011-02-22 ENCOUNTER — Ambulatory Visit (INDEPENDENT_AMBULATORY_CARE_PROVIDER_SITE_OTHER): Payer: BC Managed Care – PPO

## 2011-02-22 DIAGNOSIS — J309 Allergic rhinitis, unspecified: Secondary | ICD-10-CM

## 2011-03-02 ENCOUNTER — Ambulatory Visit (INDEPENDENT_AMBULATORY_CARE_PROVIDER_SITE_OTHER): Payer: BC Managed Care – PPO

## 2011-03-02 DIAGNOSIS — J309 Allergic rhinitis, unspecified: Secondary | ICD-10-CM

## 2011-03-06 ENCOUNTER — Encounter: Payer: Self-pay | Admitting: Internal Medicine

## 2011-03-08 ENCOUNTER — Ambulatory Visit (INDEPENDENT_AMBULATORY_CARE_PROVIDER_SITE_OTHER): Payer: BC Managed Care – PPO

## 2011-03-08 ENCOUNTER — Encounter: Payer: Self-pay | Admitting: Internal Medicine

## 2011-03-08 ENCOUNTER — Ambulatory Visit (INDEPENDENT_AMBULATORY_CARE_PROVIDER_SITE_OTHER): Payer: BC Managed Care – PPO | Admitting: Internal Medicine

## 2011-03-08 VITALS — BP 116/82 | HR 68 | Ht 68.0 in | Wt 223.6 lb

## 2011-03-08 DIAGNOSIS — J309 Allergic rhinitis, unspecified: Secondary | ICD-10-CM

## 2011-03-08 DIAGNOSIS — J301 Allergic rhinitis due to pollen: Secondary | ICD-10-CM

## 2011-03-08 DIAGNOSIS — K219 Gastro-esophageal reflux disease without esophagitis: Secondary | ICD-10-CM

## 2011-03-08 MED ORDER — FLUTICASONE PROPIONATE 50 MCG/ACT NA SUSP: 2.0000 | Freq: Every day | NASAL | Status: DC

## 2011-03-08 NOTE — Progress Notes (Signed)
  Subjective:    Patient ID: [name] Burns, male    DOB: [birth year], 40 y.o.   MRN: [id number]  HPI 03/12/11- 24 yo M with allergic rhintis and conjunctivitis, complicated by hx of GERD with stricture. Last here 01/13/10 with no acute problems reported over that time.  He continues allergy vaccine and Xyzal or Allegra. Vaccine has been at 1:50 well tolerated since we retested. Has had esophageal dilation twice so we discussed potential role of reflux as asthma trigger. He does not have dx asthma.    Review of Systems Constitutional:   No weight loss, night sweats,  Fevers, chills, fatigue, lassitude. HEENT:   No headaches,  Difficulty swallowing,  Tooth/dental problems,  Sore throat,                No sneezing, itching, ear ache, nasal congestion, post nasal drip,   CV:  No chest pain,  Orthopnea, PND, swelling in lower extremities, anasarca, dizziness, palpitations  GI  No heartburn, indigestion, abdominal pain, nausea, vomiting, diarrhea, change in bowel habits, loss of appetite  Resp: No shortness of breath with exertion or at rest.  No excess mucus, no productive cough,  No non-productive cough,  No coughing up of blood.  No change in color of mucus.  No wheezing.  Skin: no rash or lesions.  GU: no dysuria, change in color of urine, no urgency or frequency.  No flank pain.  MS:  No joint pain or swelling.  No decreased range of motion.  No back pain.  Psych:  No change in mood or affect. No depression or anxiety.  No memory loss.      Objective:   Physical Exam General- Alert, Oriented, Affect-appropriate, Distress- none acute         Minor stuffy, clear chest  Skin- rash-none, lesions- none, excoriation- none  Lymphadenopathy- none  Head- atraumatic  Eyes- Gross vision intact, PERRLA, conjunctivae clear, secretions  Ears- Hearing, canals- normal  Nose- Clear, No-Septal dev, mucus, polyps, erosion, perforation   Throat- Mallampati II , mucosa clear , drainage- none,  tonsils- atrophic  Neck- flexible , trachea midline, no stridor , thyroid nl, carotid no bruit  Chest - symmetrical excursion , unlabored     Heart/CV- RRR , no murmur , no gallop  , no rub, nl s1 s2                     - JVD- none , edema- none, stasis changes- none, varices- none     Lung- clear to P&A, wheeze- none, cough- none , dullness-none, rub- none     Chest wall-   Abd- tender-no, distended-no, bowel sounds-present, HSM- no  Br/ Gen/ Rectal- Not done, not indicated  Extrem- cyanosis- none, clubbing, none, atrophy- none, strength- nl  Neuro- grossly intact to observation         Assessment & Plan:

## 2011-03-08 NOTE — Patient Instructions (Signed)
Ok to use allegra / fexofenadine or antihistamine of choice as needed  Suggest this may be a time when your Flonase could be helpful. Script printed  We will have the allergy lab move your vaccine up to full strength

## 2011-03-08 NOTE — Assessment & Plan Note (Signed)
We will move up to 1:10  Discussed antihistamine choices

## 2011-03-18 ENCOUNTER — Encounter: Payer: Self-pay | Admitting: Internal Medicine

## 2011-03-18 NOTE — Assessment & Plan Note (Signed)
Reflux precautions 

## 2011-03-20 NOTE — Assessment & Plan Note (Signed)
[name] Burns                         [name] Burns   [name] Burns, [name] Burns                      MRN:          [id number]  DATE:01/06/2008                            DOB:          18-Mar-1971    CHIEF COMPLAINT:  40 year old white male, self-referred for swallowing  difficulties.   HISTORY OF PRESENT ILLNESS:  [name] Burns has noticed intermittent  difficulty swallowing solid foods for about the past ten months.  His  symptoms are primarily related to meats and breads.  He has had to  induce vomiting on several occasions.  He relates occasional reflux  symptoms and notes occasional upper mid-sternal chest pain, related to  dysphagia.  He has no other gastrointestinal complaints and specifically  denies any abdominal pain, change in bowel habits, change in stool  caliber, melena, hematochezia or odynophagia.   FAMILY HISTORY:  Negative for colon cancer, colon polyps or inflammatory  bowel disease.   PAST MEDICAL HISTORY:  Rhinitis.   PAST SURGICAL HISTORY:  Negative.   CURRENT MEDICATIONS:  Listed on the chart, updated and reviewed.   MEDICATION ALLERGIES:  None known.   SOCIAL HISTORY:  Per the handwritten form.   REVIEW OF SYSTEMS:  Per the handwritten form.   PHYSICAL EXAM:  GENERAL:  Overweight, white male, in no acute distress.  Height 5 feet 8.5 inches, weight 217.4 pounds, blood pressure is 100/58,  pulse 64 and regular.  HEENT EXAM:  Anicteric sclerae.  Oropharynx clear.  CHEST:  Clear to auscultation bilaterally.  CARDIAC:  Regular rate and rhythm without murmurs appreciated.  ABDOMEN:  Soft, nontender, nondistended.  Normoactive bowel sounds.  No  palpable organomegaly, masses or hernias.  EXTREMITIES:  Without clubbing, cyanosis or edema.  NEUROLOGIC:  Alert and oriented times three.  Grossly nonfocal.   ASSESSMENT AND PLAN:  Solid-food dysphagia.  Rule out peptic stricture.  Rule out underlying GERD.  Begin Prilosec  OTC one p.o. q.a.m., along  with standard antireflux  measures.  He is advised to discontinue chewing tobacco.  Risks,  benefits, and alternatives to  upper endoscopy, possible biopsy and possible dilation discussed with  the patient and he consents to proceed.  This will be scheduled  electively.     [name] Burns. [name] Dar, MD, [facility]  Electronically Signed    MTS/MedQ  DD: 01/09/2008  DT: 01/09/2008  Job #: [id number]

## 2011-03-20 NOTE — Assessment & Plan Note (Signed)
[facility]                         GASTROENTEROLOGY OFFICE NOTE   NAME:[name] Burns, [name] Burns                      MRN:          [id number]  DATE:03/01/2008                            DOB:          Jan 05, 1971    This is a return office visit for an esophageal stricture.  He has had  an excellent response to dilation and relates no dysphagia since his  dilation.  He notes very infrequent heartburn symptoms since beginning  Prilosec OTC.   CURRENT MEDICATIONS:  Listed on the chart, updated, and reviewed.   MEDICATION ALLERGIES:  None known.   PHYSICAL EXAMINATION:  GENERAL:  Overweight white male in no acute  distress.  VITAL SIGNS:  Weight 214 pounds, blood pressure is 114/70, pulse 60 and  regular.  CHEST:  Clear to auscultation bilaterally.  CARDIAC:  Regular rate and rhythm without murmurs.  ABDOMEN:  Soft and nontender with normoactive bowel sounds.   ASSESSMENT/PLAN:  Gastroesophageal reflux disease with a peptic  stricture.  Maintain omeprazole 20 mg by mouth every morning  indefinitely, along with standard antireflux measures.  Biopsies raised  the possibility of eosinophilic esophagitis, however, this is most  likely gastroesophageal reflux disease.  If his dysphagia returns or his  heartburn is not well controlled, he will return for followup and  consideration of a stronger PPI or a treatment for esosinophilic  esophagitis, otherwise return office visit in 1 year.     Venita Lick. Russella Dar, MD, [facility]  Electronically Signed    MTS/MedQ  DD: 03/01/2008  DT: 03/01/2008  Job #: [id number]

## 2011-03-21 ENCOUNTER — Ambulatory Visit (INDEPENDENT_AMBULATORY_CARE_PROVIDER_SITE_OTHER): Payer: BC Managed Care – PPO

## 2011-03-21 DIAGNOSIS — J309 Allergic rhinitis, unspecified: Secondary | ICD-10-CM

## 2011-03-23 NOTE — Assessment & Plan Note (Signed)
[facility]                             PULMONARY OFFICE NOTE   NAME:[name] Burns, [name]                      MRN:          [id number]  DATE:12/30/2006                            DOB:          [name] Burns    PROBLEM:  Allergic rhinitis.   HISTORY:  In the last two days, he has had increased head congestion  with post-nasal drainage. No fever, some sneezing, eyes are watering. He  has been taking over-the-counter Tylenol Cold and Sinus type  preparation.   MEDICATIONS:  1. Allergy vaccine with no problems.  2. P.r.n. use of either Zyrtec or Allegra 60 mg.  3. Flonase.  4. Occasional Benadryl.   DRUG INTOLERANCES:  No medication allergy.   OBJECTIVE:  Weight is 215 pounds.  Blood pressure 128/80.  Pulse regular  at 64.  Room air saturation is 96%. There is no adenopathy. Long palate  obscures posterior pharyngeal drainage or inflammation.  Conjunctivae are not injected.  Voice quality is normal.  CHEST: Is clear.  HEART: Sounds normal.   IMPRESSION:  Increased rhinitis symptoms, more likely viral than  allergic, although trees are now blooming.   PLAN:  Neo-Synephrine inhalation, Depo 80 with steroid talk. Doxycycline  for 7 days to hold. We reviewed symptomatic therapy.  He will continue  dust and allergen avoidance measures as instructed. Schedule return in  one year, earlier p.r.n.     Clinton D. Maple Hudson, MD, [facility], FACP  Electronically Signed    CDY/MedQ  DD: 01/01/2007  DT: 01/01/2007  Job #: [id number]   cc:   Olena Leatherwood [facility]

## 2011-03-23 NOTE — Assessment & Plan Note (Signed)
[facility]                               PULMONARY OFFICE NOTE   NAME:[name] Burns, [name] Burns                      MRN:          [id number]  DATE:07/30/2006                            DOB:          1971-08-27    PROBLEM:  Allergic rhinitis.   HISTORY:  Last seen in January.  He was doing well until he mowed lawn  yesterday without a mask and has developed significant head congestion,  unpleasant pressure behind the eyes and in the ears, asking relief.  He  wanted a nebulizer treatment as we had done in the past.  There has been  nothing purulent or bloody, and he denies chest tightness, wheeze or cough.  He has continued allergy vaccine here at 1 to 10 with no problems.   MEDICATIONS:  1. Allergy vaccine.  2. Zyrtec or Allegra 60 mg.  3. Flonase.   ALLERGIES:  No medication allergy.   OBJECTIVE:  VITAL SIGNS:  Weight 212 pounds, blood pressure 108/68, pulse  regular 64, room air saturation 96%.  HEENT:  Conjunctivae are not injected.  There is turbinate edema, but no  postnasal drainage seen.  Tympanic membranes are not bulging.  NECK:  No neck vein distention.  No adenopathy.  LUNGS:  Clear.  HEART SOUNDS:  Regular.   IMPRESSION:  Acute exacerbation of allergic rhinitis.   PLAN:  Nasal inhalation treatment with Neo-Synephrine, Depo Medrol 80 mg IM.  Scheduled to return as planned in January for a 1-year follow-up, earlier as  needed.  Continue vaccine at 1 to 10.                                   Clinton D. Maple Hudson, MD, [facility], FACP   CDY/MedQ  DD:  07/30/2006  DT:  08/01/2006  Job #:  [id number]   cc:   Olena Leatherwood [facility]

## 2011-03-29 ENCOUNTER — Ambulatory Visit (INDEPENDENT_AMBULATORY_CARE_PROVIDER_SITE_OTHER): Payer: BC Managed Care – PPO

## 2011-03-29 DIAGNOSIS — J309 Allergic rhinitis, unspecified: Secondary | ICD-10-CM

## 2011-04-06 ENCOUNTER — Ambulatory Visit (INDEPENDENT_AMBULATORY_CARE_PROVIDER_SITE_OTHER): Payer: BC Managed Care – PPO

## 2011-04-06 DIAGNOSIS — J309 Allergic rhinitis, unspecified: Secondary | ICD-10-CM

## 2011-04-13 ENCOUNTER — Ambulatory Visit (INDEPENDENT_AMBULATORY_CARE_PROVIDER_SITE_OTHER): Payer: BC Managed Care – PPO

## 2011-04-13 DIAGNOSIS — J309 Allergic rhinitis, unspecified: Secondary | ICD-10-CM

## 2011-04-20 ENCOUNTER — Ambulatory Visit (INDEPENDENT_AMBULATORY_CARE_PROVIDER_SITE_OTHER): Payer: BC Managed Care – PPO

## 2011-04-20 DIAGNOSIS — J309 Allergic rhinitis, unspecified: Secondary | ICD-10-CM

## 2011-04-27 ENCOUNTER — Ambulatory Visit (INDEPENDENT_AMBULATORY_CARE_PROVIDER_SITE_OTHER): Payer: BC Managed Care – PPO

## 2011-04-27 DIAGNOSIS — J309 Allergic rhinitis, unspecified: Secondary | ICD-10-CM

## 2011-05-04 ENCOUNTER — Ambulatory Visit (INDEPENDENT_AMBULATORY_CARE_PROVIDER_SITE_OTHER): Payer: BC Managed Care – PPO

## 2011-05-04 DIAGNOSIS — J309 Allergic rhinitis, unspecified: Secondary | ICD-10-CM

## 2011-05-07 ENCOUNTER — Encounter: Payer: Self-pay | Admitting: Internal Medicine

## 2011-05-18 ENCOUNTER — Ambulatory Visit (INDEPENDENT_AMBULATORY_CARE_PROVIDER_SITE_OTHER): Payer: BC Managed Care – PPO

## 2011-05-18 DIAGNOSIS — J309 Allergic rhinitis, unspecified: Secondary | ICD-10-CM

## 2011-05-31 ENCOUNTER — Ambulatory Visit (INDEPENDENT_AMBULATORY_CARE_PROVIDER_SITE_OTHER): Payer: BC Managed Care – PPO

## 2011-05-31 DIAGNOSIS — J309 Allergic rhinitis, unspecified: Secondary | ICD-10-CM

## 2011-06-08 ENCOUNTER — Ambulatory Visit (INDEPENDENT_AMBULATORY_CARE_PROVIDER_SITE_OTHER): Payer: BC Managed Care – PPO

## 2011-06-08 DIAGNOSIS — J309 Allergic rhinitis, unspecified: Secondary | ICD-10-CM

## 2011-06-15 ENCOUNTER — Ambulatory Visit (INDEPENDENT_AMBULATORY_CARE_PROVIDER_SITE_OTHER): Payer: BC Managed Care – PPO

## 2011-06-15 DIAGNOSIS — J309 Allergic rhinitis, unspecified: Secondary | ICD-10-CM

## 2011-06-29 ENCOUNTER — Ambulatory Visit (INDEPENDENT_AMBULATORY_CARE_PROVIDER_SITE_OTHER): Payer: BC Managed Care – PPO

## 2011-06-29 DIAGNOSIS — J309 Allergic rhinitis, unspecified: Secondary | ICD-10-CM

## 2011-07-03 ENCOUNTER — Ambulatory Visit (INDEPENDENT_AMBULATORY_CARE_PROVIDER_SITE_OTHER): Payer: BC Managed Care – PPO

## 2011-07-03 DIAGNOSIS — J309 Allergic rhinitis, unspecified: Secondary | ICD-10-CM

## 2011-07-06 ENCOUNTER — Ambulatory Visit (INDEPENDENT_AMBULATORY_CARE_PROVIDER_SITE_OTHER): Payer: BC Managed Care – PPO

## 2011-07-06 DIAGNOSIS — J309 Allergic rhinitis, unspecified: Secondary | ICD-10-CM

## 2011-07-13 ENCOUNTER — Ambulatory Visit (INDEPENDENT_AMBULATORY_CARE_PROVIDER_SITE_OTHER): Payer: BC Managed Care – PPO

## 2011-07-13 DIAGNOSIS — J309 Allergic rhinitis, unspecified: Secondary | ICD-10-CM

## 2011-07-19 ENCOUNTER — Ambulatory Visit (INDEPENDENT_AMBULATORY_CARE_PROVIDER_SITE_OTHER): Payer: BC Managed Care – PPO

## 2011-07-19 DIAGNOSIS — J309 Allergic rhinitis, unspecified: Secondary | ICD-10-CM

## 2011-07-27 ENCOUNTER — Ambulatory Visit (INDEPENDENT_AMBULATORY_CARE_PROVIDER_SITE_OTHER): Payer: BC Managed Care – PPO

## 2011-07-27 DIAGNOSIS — J309 Allergic rhinitis, unspecified: Secondary | ICD-10-CM

## 2011-08-02 ENCOUNTER — Ambulatory Visit (INDEPENDENT_AMBULATORY_CARE_PROVIDER_SITE_OTHER): Payer: BC Managed Care – PPO

## 2011-08-02 DIAGNOSIS — J309 Allergic rhinitis, unspecified: Secondary | ICD-10-CM

## 2011-08-10 ENCOUNTER — Ambulatory Visit (INDEPENDENT_AMBULATORY_CARE_PROVIDER_SITE_OTHER): Payer: BC Managed Care – PPO

## 2011-08-10 DIAGNOSIS — J309 Allergic rhinitis, unspecified: Secondary | ICD-10-CM

## 2011-08-16 ENCOUNTER — Ambulatory Visit (INDEPENDENT_AMBULATORY_CARE_PROVIDER_SITE_OTHER): Payer: BC Managed Care – PPO

## 2011-08-16 DIAGNOSIS — J309 Allergic rhinitis, unspecified: Secondary | ICD-10-CM

## 2011-08-24 ENCOUNTER — Ambulatory Visit (INDEPENDENT_AMBULATORY_CARE_PROVIDER_SITE_OTHER): Payer: BC Managed Care – PPO

## 2011-08-24 DIAGNOSIS — J309 Allergic rhinitis, unspecified: Secondary | ICD-10-CM

## 2011-08-27 ENCOUNTER — Encounter: Payer: Self-pay | Admitting: Internal Medicine

## 2011-08-31 ENCOUNTER — Ambulatory Visit (INDEPENDENT_AMBULATORY_CARE_PROVIDER_SITE_OTHER): Payer: BC Managed Care – PPO

## 2011-08-31 DIAGNOSIS — J309 Allergic rhinitis, unspecified: Secondary | ICD-10-CM

## 2011-09-10 ENCOUNTER — Ambulatory Visit (INDEPENDENT_AMBULATORY_CARE_PROVIDER_SITE_OTHER): Payer: BC Managed Care – PPO

## 2011-09-10 DIAGNOSIS — Z23 Encounter for immunization: Secondary | ICD-10-CM

## 2011-09-10 DIAGNOSIS — J309 Allergic rhinitis, unspecified: Secondary | ICD-10-CM

## 2011-09-19 ENCOUNTER — Ambulatory Visit (INDEPENDENT_AMBULATORY_CARE_PROVIDER_SITE_OTHER): Payer: BC Managed Care – PPO

## 2011-09-19 DIAGNOSIS — J309 Allergic rhinitis, unspecified: Secondary | ICD-10-CM

## 2011-09-25 ENCOUNTER — Ambulatory Visit (INDEPENDENT_AMBULATORY_CARE_PROVIDER_SITE_OTHER): Payer: BC Managed Care – PPO

## 2011-09-25 DIAGNOSIS — J309 Allergic rhinitis, unspecified: Secondary | ICD-10-CM

## 2011-10-05 ENCOUNTER — Ambulatory Visit (INDEPENDENT_AMBULATORY_CARE_PROVIDER_SITE_OTHER): Payer: BC Managed Care – PPO

## 2011-10-05 DIAGNOSIS — J309 Allergic rhinitis, unspecified: Secondary | ICD-10-CM

## 2011-10-12 ENCOUNTER — Ambulatory Visit (INDEPENDENT_AMBULATORY_CARE_PROVIDER_SITE_OTHER): Payer: BC Managed Care – PPO

## 2011-10-12 DIAGNOSIS — J309 Allergic rhinitis, unspecified: Secondary | ICD-10-CM

## 2011-10-18 ENCOUNTER — Ambulatory Visit (INDEPENDENT_AMBULATORY_CARE_PROVIDER_SITE_OTHER): Payer: BC Managed Care – PPO

## 2011-10-18 DIAGNOSIS — J309 Allergic rhinitis, unspecified: Secondary | ICD-10-CM

## 2011-10-25 ENCOUNTER — Ambulatory Visit (INDEPENDENT_AMBULATORY_CARE_PROVIDER_SITE_OTHER): Payer: BC Managed Care – PPO

## 2011-10-25 DIAGNOSIS — J309 Allergic rhinitis, unspecified: Secondary | ICD-10-CM

## 2011-11-05 ENCOUNTER — Ambulatory Visit (INDEPENDENT_AMBULATORY_CARE_PROVIDER_SITE_OTHER): Payer: BC Managed Care – PPO

## 2011-11-05 DIAGNOSIS — J309 Allergic rhinitis, unspecified: Secondary | ICD-10-CM

## 2011-11-13 ENCOUNTER — Ambulatory Visit (INDEPENDENT_AMBULATORY_CARE_PROVIDER_SITE_OTHER): Payer: BC Managed Care – PPO

## 2011-11-13 DIAGNOSIS — J309 Allergic rhinitis, unspecified: Secondary | ICD-10-CM

## 2011-11-20 ENCOUNTER — Ambulatory Visit (INDEPENDENT_AMBULATORY_CARE_PROVIDER_SITE_OTHER): Payer: BC Managed Care – PPO

## 2011-11-20 DIAGNOSIS — J309 Allergic rhinitis, unspecified: Secondary | ICD-10-CM

## 2011-11-28 ENCOUNTER — Ambulatory Visit (INDEPENDENT_AMBULATORY_CARE_PROVIDER_SITE_OTHER): Payer: BC Managed Care – PPO

## 2011-11-28 DIAGNOSIS — J309 Allergic rhinitis, unspecified: Secondary | ICD-10-CM

## 2011-11-29 ENCOUNTER — Ambulatory Visit (INDEPENDENT_AMBULATORY_CARE_PROVIDER_SITE_OTHER): Payer: BC Managed Care – PPO

## 2011-11-29 DIAGNOSIS — J309 Allergic rhinitis, unspecified: Secondary | ICD-10-CM

## 2011-12-07 ENCOUNTER — Ambulatory Visit (INDEPENDENT_AMBULATORY_CARE_PROVIDER_SITE_OTHER): Payer: BC Managed Care – PPO

## 2011-12-07 DIAGNOSIS — J309 Allergic rhinitis, unspecified: Secondary | ICD-10-CM

## 2011-12-14 ENCOUNTER — Ambulatory Visit (INDEPENDENT_AMBULATORY_CARE_PROVIDER_SITE_OTHER): Payer: BC Managed Care – PPO

## 2011-12-14 ENCOUNTER — Encounter: Payer: Self-pay | Admitting: Internal Medicine

## 2011-12-14 DIAGNOSIS — J309 Allergic rhinitis, unspecified: Secondary | ICD-10-CM

## 2011-12-21 ENCOUNTER — Ambulatory Visit (INDEPENDENT_AMBULATORY_CARE_PROVIDER_SITE_OTHER): Payer: BC Managed Care – PPO

## 2011-12-21 DIAGNOSIS — J309 Allergic rhinitis, unspecified: Secondary | ICD-10-CM

## 2011-12-28 ENCOUNTER — Ambulatory Visit (INDEPENDENT_AMBULATORY_CARE_PROVIDER_SITE_OTHER): Payer: BC Managed Care – PPO

## 2011-12-28 DIAGNOSIS — J309 Allergic rhinitis, unspecified: Secondary | ICD-10-CM

## 2012-01-04 ENCOUNTER — Ambulatory Visit (INDEPENDENT_AMBULATORY_CARE_PROVIDER_SITE_OTHER): Payer: BC Managed Care – PPO

## 2012-01-04 DIAGNOSIS — J309 Allergic rhinitis, unspecified: Secondary | ICD-10-CM

## 2012-01-11 ENCOUNTER — Ambulatory Visit (INDEPENDENT_AMBULATORY_CARE_PROVIDER_SITE_OTHER): Payer: BC Managed Care – PPO

## 2012-01-11 DIAGNOSIS — J309 Allergic rhinitis, unspecified: Secondary | ICD-10-CM

## 2012-01-18 ENCOUNTER — Ambulatory Visit (INDEPENDENT_AMBULATORY_CARE_PROVIDER_SITE_OTHER): Payer: BC Managed Care – PPO

## 2012-01-18 DIAGNOSIS — J309 Allergic rhinitis, unspecified: Secondary | ICD-10-CM

## 2012-01-25 ENCOUNTER — Ambulatory Visit (INDEPENDENT_AMBULATORY_CARE_PROVIDER_SITE_OTHER): Payer: BC Managed Care – PPO

## 2012-01-25 DIAGNOSIS — J309 Allergic rhinitis, unspecified: Secondary | ICD-10-CM

## 2012-01-31 ENCOUNTER — Ambulatory Visit (INDEPENDENT_AMBULATORY_CARE_PROVIDER_SITE_OTHER): Payer: BC Managed Care – PPO

## 2012-01-31 DIAGNOSIS — J309 Allergic rhinitis, unspecified: Secondary | ICD-10-CM

## 2012-02-08 ENCOUNTER — Ambulatory Visit (INDEPENDENT_AMBULATORY_CARE_PROVIDER_SITE_OTHER): Payer: BC Managed Care – PPO

## 2012-02-08 DIAGNOSIS — J309 Allergic rhinitis, unspecified: Secondary | ICD-10-CM

## 2012-02-15 ENCOUNTER — Ambulatory Visit (INDEPENDENT_AMBULATORY_CARE_PROVIDER_SITE_OTHER): Payer: BC Managed Care – PPO

## 2012-02-15 DIAGNOSIS — J309 Allergic rhinitis, unspecified: Secondary | ICD-10-CM

## 2012-02-22 ENCOUNTER — Ambulatory Visit (INDEPENDENT_AMBULATORY_CARE_PROVIDER_SITE_OTHER): Payer: BC Managed Care – PPO

## 2012-02-22 DIAGNOSIS — J309 Allergic rhinitis, unspecified: Secondary | ICD-10-CM

## 2012-02-29 ENCOUNTER — Ambulatory Visit (INDEPENDENT_AMBULATORY_CARE_PROVIDER_SITE_OTHER): Payer: BC Managed Care – PPO

## 2012-02-29 DIAGNOSIS — J309 Allergic rhinitis, unspecified: Secondary | ICD-10-CM

## 2012-03-06 ENCOUNTER — Ambulatory Visit (INDEPENDENT_AMBULATORY_CARE_PROVIDER_SITE_OTHER): Payer: BC Managed Care – PPO

## 2012-03-06 DIAGNOSIS — J309 Allergic rhinitis, unspecified: Secondary | ICD-10-CM

## 2012-03-14 ENCOUNTER — Ambulatory Visit (INDEPENDENT_AMBULATORY_CARE_PROVIDER_SITE_OTHER): Payer: BC Managed Care – PPO

## 2012-03-14 DIAGNOSIS — J309 Allergic rhinitis, unspecified: Secondary | ICD-10-CM

## 2012-03-18 ENCOUNTER — Encounter: Payer: Self-pay | Admitting: Internal Medicine

## 2012-03-21 ENCOUNTER — Ambulatory Visit (INDEPENDENT_AMBULATORY_CARE_PROVIDER_SITE_OTHER): Payer: BC Managed Care – PPO

## 2012-03-21 DIAGNOSIS — J309 Allergic rhinitis, unspecified: Secondary | ICD-10-CM

## 2012-04-03 ENCOUNTER — Ambulatory Visit (INDEPENDENT_AMBULATORY_CARE_PROVIDER_SITE_OTHER): Payer: BC Managed Care – PPO

## 2012-04-03 DIAGNOSIS — J309 Allergic rhinitis, unspecified: Secondary | ICD-10-CM

## 2012-04-11 ENCOUNTER — Ambulatory Visit (INDEPENDENT_AMBULATORY_CARE_PROVIDER_SITE_OTHER): Payer: BC Managed Care – PPO

## 2012-04-11 DIAGNOSIS — J309 Allergic rhinitis, unspecified: Secondary | ICD-10-CM

## 2012-04-18 ENCOUNTER — Ambulatory Visit (INDEPENDENT_AMBULATORY_CARE_PROVIDER_SITE_OTHER): Payer: BC Managed Care – PPO

## 2012-04-18 DIAGNOSIS — J309 Allergic rhinitis, unspecified: Secondary | ICD-10-CM

## 2012-04-21 ENCOUNTER — Ambulatory Visit (INDEPENDENT_AMBULATORY_CARE_PROVIDER_SITE_OTHER): Payer: BC Managed Care – PPO

## 2012-04-21 DIAGNOSIS — J309 Allergic rhinitis, unspecified: Secondary | ICD-10-CM

## 2012-04-25 ENCOUNTER — Ambulatory Visit (INDEPENDENT_AMBULATORY_CARE_PROVIDER_SITE_OTHER): Payer: BC Managed Care – PPO

## 2012-04-25 DIAGNOSIS — J309 Allergic rhinitis, unspecified: Secondary | ICD-10-CM

## 2012-05-02 ENCOUNTER — Ambulatory Visit (INDEPENDENT_AMBULATORY_CARE_PROVIDER_SITE_OTHER): Payer: BC Managed Care – PPO

## 2012-05-02 DIAGNOSIS — J309 Allergic rhinitis, unspecified: Secondary | ICD-10-CM

## 2012-05-09 ENCOUNTER — Ambulatory Visit (INDEPENDENT_AMBULATORY_CARE_PROVIDER_SITE_OTHER): Payer: BC Managed Care – PPO

## 2012-05-09 DIAGNOSIS — J309 Allergic rhinitis, unspecified: Secondary | ICD-10-CM

## 2012-05-16 ENCOUNTER — Ambulatory Visit (INDEPENDENT_AMBULATORY_CARE_PROVIDER_SITE_OTHER): Payer: BC Managed Care – PPO

## 2012-05-16 DIAGNOSIS — J309 Allergic rhinitis, unspecified: Secondary | ICD-10-CM

## 2012-05-29 ENCOUNTER — Ambulatory Visit (INDEPENDENT_AMBULATORY_CARE_PROVIDER_SITE_OTHER): Payer: BC Managed Care – PPO

## 2012-05-29 DIAGNOSIS — J309 Allergic rhinitis, unspecified: Secondary | ICD-10-CM

## 2012-06-06 ENCOUNTER — Ambulatory Visit (INDEPENDENT_AMBULATORY_CARE_PROVIDER_SITE_OTHER): Payer: BC Managed Care – PPO

## 2012-06-06 DIAGNOSIS — J309 Allergic rhinitis, unspecified: Secondary | ICD-10-CM

## 2012-06-12 ENCOUNTER — Ambulatory Visit (INDEPENDENT_AMBULATORY_CARE_PROVIDER_SITE_OTHER): Payer: BC Managed Care – PPO

## 2012-06-12 DIAGNOSIS — J309 Allergic rhinitis, unspecified: Secondary | ICD-10-CM

## 2012-06-26 ENCOUNTER — Ambulatory Visit (INDEPENDENT_AMBULATORY_CARE_PROVIDER_SITE_OTHER): Payer: BC Managed Care – PPO

## 2012-06-26 DIAGNOSIS — J309 Allergic rhinitis, unspecified: Secondary | ICD-10-CM

## 2012-07-03 ENCOUNTER — Ambulatory Visit (INDEPENDENT_AMBULATORY_CARE_PROVIDER_SITE_OTHER): Payer: BC Managed Care – PPO

## 2012-07-03 DIAGNOSIS — J309 Allergic rhinitis, unspecified: Secondary | ICD-10-CM

## 2012-07-11 ENCOUNTER — Ambulatory Visit (INDEPENDENT_AMBULATORY_CARE_PROVIDER_SITE_OTHER): Payer: BC Managed Care – PPO

## 2012-07-11 DIAGNOSIS — J309 Allergic rhinitis, unspecified: Secondary | ICD-10-CM

## 2012-07-18 ENCOUNTER — Ambulatory Visit (INDEPENDENT_AMBULATORY_CARE_PROVIDER_SITE_OTHER): Payer: BC Managed Care – PPO

## 2012-07-18 DIAGNOSIS — J309 Allergic rhinitis, unspecified: Secondary | ICD-10-CM

## 2012-07-22 ENCOUNTER — Encounter: Payer: Self-pay | Admitting: Internal Medicine

## 2012-08-01 ENCOUNTER — Ambulatory Visit (INDEPENDENT_AMBULATORY_CARE_PROVIDER_SITE_OTHER): Payer: BC Managed Care – PPO

## 2012-08-01 DIAGNOSIS — J309 Allergic rhinitis, unspecified: Secondary | ICD-10-CM

## 2012-08-08 ENCOUNTER — Ambulatory Visit (INDEPENDENT_AMBULATORY_CARE_PROVIDER_SITE_OTHER): Payer: BC Managed Care – PPO

## 2012-08-08 DIAGNOSIS — J309 Allergic rhinitis, unspecified: Secondary | ICD-10-CM

## 2012-08-15 ENCOUNTER — Ambulatory Visit (INDEPENDENT_AMBULATORY_CARE_PROVIDER_SITE_OTHER): Payer: BC Managed Care – PPO

## 2012-08-15 DIAGNOSIS — J309 Allergic rhinitis, unspecified: Secondary | ICD-10-CM

## 2012-08-22 ENCOUNTER — Ambulatory Visit (INDEPENDENT_AMBULATORY_CARE_PROVIDER_SITE_OTHER): Payer: BC Managed Care – PPO

## 2012-08-22 DIAGNOSIS — J309 Allergic rhinitis, unspecified: Secondary | ICD-10-CM

## 2012-09-05 ENCOUNTER — Ambulatory Visit (INDEPENDENT_AMBULATORY_CARE_PROVIDER_SITE_OTHER): Payer: BC Managed Care – PPO

## 2012-09-05 DIAGNOSIS — J309 Allergic rhinitis, unspecified: Secondary | ICD-10-CM

## 2012-09-05 DIAGNOSIS — Z23 Encounter for immunization: Secondary | ICD-10-CM

## 2012-09-12 ENCOUNTER — Ambulatory Visit (INDEPENDENT_AMBULATORY_CARE_PROVIDER_SITE_OTHER): Payer: BC Managed Care – PPO

## 2012-09-12 DIAGNOSIS — J309 Allergic rhinitis, unspecified: Secondary | ICD-10-CM

## 2012-09-19 ENCOUNTER — Ambulatory Visit (INDEPENDENT_AMBULATORY_CARE_PROVIDER_SITE_OTHER): Payer: BC Managed Care – PPO

## 2012-09-19 DIAGNOSIS — J309 Allergic rhinitis, unspecified: Secondary | ICD-10-CM

## 2012-09-22 ENCOUNTER — Ambulatory Visit (INDEPENDENT_AMBULATORY_CARE_PROVIDER_SITE_OTHER): Payer: BC Managed Care – PPO

## 2012-09-22 DIAGNOSIS — J309 Allergic rhinitis, unspecified: Secondary | ICD-10-CM

## 2012-09-30 ENCOUNTER — Ambulatory Visit (INDEPENDENT_AMBULATORY_CARE_PROVIDER_SITE_OTHER): Payer: BC Managed Care – PPO

## 2012-09-30 DIAGNOSIS — J309 Allergic rhinitis, unspecified: Secondary | ICD-10-CM

## 2012-10-10 ENCOUNTER — Ambulatory Visit (INDEPENDENT_AMBULATORY_CARE_PROVIDER_SITE_OTHER): Payer: BC Managed Care – PPO

## 2012-10-10 DIAGNOSIS — J309 Allergic rhinitis, unspecified: Secondary | ICD-10-CM

## 2012-10-17 ENCOUNTER — Ambulatory Visit (INDEPENDENT_AMBULATORY_CARE_PROVIDER_SITE_OTHER): Payer: BC Managed Care – PPO

## 2012-10-17 DIAGNOSIS — J309 Allergic rhinitis, unspecified: Secondary | ICD-10-CM

## 2012-11-07 ENCOUNTER — Ambulatory Visit (INDEPENDENT_AMBULATORY_CARE_PROVIDER_SITE_OTHER): Payer: BC Managed Care – PPO

## 2012-11-07 DIAGNOSIS — J309 Allergic rhinitis, unspecified: Secondary | ICD-10-CM

## 2012-11-14 ENCOUNTER — Ambulatory Visit (INDEPENDENT_AMBULATORY_CARE_PROVIDER_SITE_OTHER): Payer: BC Managed Care – PPO

## 2012-11-14 DIAGNOSIS — J309 Allergic rhinitis, unspecified: Secondary | ICD-10-CM

## 2012-11-21 ENCOUNTER — Ambulatory Visit (INDEPENDENT_AMBULATORY_CARE_PROVIDER_SITE_OTHER): Payer: BC Managed Care – PPO

## 2012-11-21 DIAGNOSIS — J309 Allergic rhinitis, unspecified: Secondary | ICD-10-CM

## 2012-11-25 ENCOUNTER — Encounter: Payer: Self-pay | Admitting: Internal Medicine

## 2012-11-28 ENCOUNTER — Ambulatory Visit (INDEPENDENT_AMBULATORY_CARE_PROVIDER_SITE_OTHER): Payer: BC Managed Care – PPO

## 2012-11-28 DIAGNOSIS — J309 Allergic rhinitis, unspecified: Secondary | ICD-10-CM

## 2012-12-05 ENCOUNTER — Ambulatory Visit (INDEPENDENT_AMBULATORY_CARE_PROVIDER_SITE_OTHER): Payer: BC Managed Care – PPO

## 2012-12-05 DIAGNOSIS — J309 Allergic rhinitis, unspecified: Secondary | ICD-10-CM

## 2012-12-12 ENCOUNTER — Ambulatory Visit: Payer: BC Managed Care – PPO

## 2012-12-22 ENCOUNTER — Encounter: Payer: Self-pay | Admitting: Gastroenterology

## 2012-12-26 ENCOUNTER — Ambulatory Visit (INDEPENDENT_AMBULATORY_CARE_PROVIDER_SITE_OTHER): Payer: BC Managed Care – PPO

## 2012-12-26 DIAGNOSIS — J309 Allergic rhinitis, unspecified: Secondary | ICD-10-CM

## 2013-01-02 ENCOUNTER — Ambulatory Visit (INDEPENDENT_AMBULATORY_CARE_PROVIDER_SITE_OTHER): Payer: BC Managed Care – PPO

## 2013-01-02 DIAGNOSIS — J309 Allergic rhinitis, unspecified: Secondary | ICD-10-CM

## 2013-01-05 ENCOUNTER — Ambulatory Visit (INDEPENDENT_AMBULATORY_CARE_PROVIDER_SITE_OTHER): Payer: BC Managed Care – PPO | Admitting: Gastroenterology

## 2013-01-05 ENCOUNTER — Encounter: Payer: Self-pay | Admitting: Gastroenterology

## 2013-01-05 VITALS — BP 92/64 | HR 64 | Ht 67.5 in | Wt 220.4 lb

## 2013-01-05 DIAGNOSIS — K219 Gastro-esophageal reflux disease without esophagitis: Secondary | ICD-10-CM

## 2013-01-05 MED ORDER — OMEPRAZOLE 20 MG PO CPDR: 20.0000 mg | DELAYED_RELEASE_CAPSULE | Freq: Every day | ORAL | Status: AC

## 2013-01-05 NOTE — Patient Instructions (Addendum)
We have sent the following medications to your pharmacy for you to pick up at your convenience:omeprazole.  Thank you for choosing me and [facility].  Malcolm T. Stark, Jr., MD., FACG   

## 2013-01-05 NOTE — Progress Notes (Signed)
History of Present Illness: This is a 42 year old male with a history of GERD and esophageal stricture. He underwent upper endoscopy with dilation in 2009. He takes omeprazole about every other day or every third day and states that this controls his reflux symptoms and he relates no recurrent dysphagia. About 3 weeks ago he had 2 days of severe nausea vomiting and watery diarrhea. These symptoms have completely abated. He also noted several weeks of slightly looser stools occasionally they were floating. He changed his diet began taking protein shakes every morning. Since this time his bowel habits have returned to normal and he currently has no gastrointestinal complaints. Denies weight loss, abdominal pain, constipation, diarrhea, change in stool caliber, melena, hematochezia, nausea, vomiting, dysphagia, reflux symptoms, chest pain.  Review of Systems: Pertinent positive and negative review of systems were noted in the above HPI section. All other review of systems were otherwise negative.  Current Medications, Allergies, Past Medical History, Past Surgical History, Family History and Social History were reviewed in [employer] record.  Physical Exam: General: Well developed , well nourished, no acute distress Head: Normocephalic and atraumatic Eyes:  sclerae anicteric, EOMI Ears: Normal auditory acuity Mouth: No deformity or lesions Neck: Supple, no masses or thyromegaly Lungs: Clear throughout to auscultation Heart: Regular rate and rhythm; no murmurs, rubs or bruits Abdomen: Soft, non tender and non distended. No masses, hepatosplenomegaly or hernias noted. Normal Bowel sounds Musculoskeletal: Symmetrical with no gross deformities  Skin: No lesions on visible extremities Pulses:  Normal pulses noted Extremities: No clubbing, cyanosis, edema or deformities noted Neurological: Alert oriented x 4, grossly nonfocal Cervical Nodes:  No significant cervical  adenopathy Inguinal Nodes: No significant inguinal adenopathy Psychological:  Alert and cooperative. Normal mood and affect  Assessment and Recommendations:  1. GERD with a history of a peptic stricture. Continue omeprazole every other day. Continue standard antireflux measures. Ongoing followup with his PCP.  2. Self-limited nausea, vomiting diarrhea occurring 3 weeks ago. I suspect this was a gastroenteritis.  3. Slight change in bowel habits that have corrected change in diet. If the symptoms recur he is advised to contact me for further followup and additional recommendations.

## 2013-01-09 ENCOUNTER — Ambulatory Visit: Payer: BC Managed Care – PPO

## 2013-01-16 ENCOUNTER — Ambulatory Visit (INDEPENDENT_AMBULATORY_CARE_PROVIDER_SITE_OTHER): Payer: BC Managed Care – PPO

## 2013-01-16 DIAGNOSIS — J309 Allergic rhinitis, unspecified: Secondary | ICD-10-CM

## 2013-01-23 ENCOUNTER — Ambulatory Visit (INDEPENDENT_AMBULATORY_CARE_PROVIDER_SITE_OTHER): Payer: BC Managed Care – PPO

## 2013-01-23 DIAGNOSIS — J309 Allergic rhinitis, unspecified: Secondary | ICD-10-CM

## 2013-01-30 ENCOUNTER — Ambulatory Visit (INDEPENDENT_AMBULATORY_CARE_PROVIDER_SITE_OTHER): Payer: BC Managed Care – PPO

## 2013-01-30 DIAGNOSIS — J309 Allergic rhinitis, unspecified: Secondary | ICD-10-CM

## 2013-02-06 ENCOUNTER — Ambulatory Visit (INDEPENDENT_AMBULATORY_CARE_PROVIDER_SITE_OTHER): Payer: BC Managed Care – PPO

## 2013-02-06 DIAGNOSIS — J309 Allergic rhinitis, unspecified: Secondary | ICD-10-CM

## 2013-02-13 ENCOUNTER — Ambulatory Visit (INDEPENDENT_AMBULATORY_CARE_PROVIDER_SITE_OTHER): Payer: BC Managed Care – PPO

## 2013-02-13 DIAGNOSIS — J309 Allergic rhinitis, unspecified: Secondary | ICD-10-CM

## 2013-02-19 ENCOUNTER — Ambulatory Visit (INDEPENDENT_AMBULATORY_CARE_PROVIDER_SITE_OTHER): Payer: BC Managed Care – PPO

## 2013-02-19 DIAGNOSIS — J309 Allergic rhinitis, unspecified: Secondary | ICD-10-CM

## 2013-02-27 ENCOUNTER — Ambulatory Visit (INDEPENDENT_AMBULATORY_CARE_PROVIDER_SITE_OTHER): Payer: BC Managed Care – PPO

## 2013-02-27 DIAGNOSIS — J309 Allergic rhinitis, unspecified: Secondary | ICD-10-CM

## 2013-03-06 ENCOUNTER — Ambulatory Visit (INDEPENDENT_AMBULATORY_CARE_PROVIDER_SITE_OTHER): Payer: BC Managed Care – PPO

## 2013-03-06 DIAGNOSIS — J309 Allergic rhinitis, unspecified: Secondary | ICD-10-CM

## 2013-03-13 ENCOUNTER — Ambulatory Visit (INDEPENDENT_AMBULATORY_CARE_PROVIDER_SITE_OTHER): Payer: BC Managed Care – PPO

## 2013-03-13 DIAGNOSIS — J309 Allergic rhinitis, unspecified: Secondary | ICD-10-CM

## 2013-03-16 ENCOUNTER — Ambulatory Visit (INDEPENDENT_AMBULATORY_CARE_PROVIDER_SITE_OTHER): Payer: BC Managed Care – PPO

## 2013-03-16 DIAGNOSIS — J309 Allergic rhinitis, unspecified: Secondary | ICD-10-CM

## 2013-03-20 ENCOUNTER — Ambulatory Visit (INDEPENDENT_AMBULATORY_CARE_PROVIDER_SITE_OTHER): Payer: BC Managed Care – PPO

## 2013-03-20 DIAGNOSIS — J309 Allergic rhinitis, unspecified: Secondary | ICD-10-CM

## 2013-03-27 ENCOUNTER — Ambulatory Visit (INDEPENDENT_AMBULATORY_CARE_PROVIDER_SITE_OTHER): Payer: BC Managed Care – PPO

## 2013-03-27 DIAGNOSIS — J309 Allergic rhinitis, unspecified: Secondary | ICD-10-CM

## 2013-04-03 ENCOUNTER — Ambulatory Visit (INDEPENDENT_AMBULATORY_CARE_PROVIDER_SITE_OTHER): Payer: BC Managed Care – PPO

## 2013-04-03 DIAGNOSIS — J309 Allergic rhinitis, unspecified: Secondary | ICD-10-CM

## 2013-04-10 ENCOUNTER — Ambulatory Visit (INDEPENDENT_AMBULATORY_CARE_PROVIDER_SITE_OTHER): Payer: BC Managed Care – PPO

## 2013-04-10 DIAGNOSIS — J309 Allergic rhinitis, unspecified: Secondary | ICD-10-CM

## 2013-04-17 ENCOUNTER — Ambulatory Visit (INDEPENDENT_AMBULATORY_CARE_PROVIDER_SITE_OTHER): Payer: BC Managed Care – PPO

## 2013-04-17 DIAGNOSIS — J309 Allergic rhinitis, unspecified: Secondary | ICD-10-CM

## 2013-04-24 ENCOUNTER — Ambulatory Visit (INDEPENDENT_AMBULATORY_CARE_PROVIDER_SITE_OTHER): Payer: BC Managed Care – PPO

## 2013-04-24 DIAGNOSIS — J309 Allergic rhinitis, unspecified: Secondary | ICD-10-CM

## 2013-05-01 ENCOUNTER — Ambulatory Visit (INDEPENDENT_AMBULATORY_CARE_PROVIDER_SITE_OTHER): Payer: BC Managed Care – PPO

## 2013-05-01 DIAGNOSIS — J309 Allergic rhinitis, unspecified: Secondary | ICD-10-CM

## 2013-05-14 ENCOUNTER — Ambulatory Visit (INDEPENDENT_AMBULATORY_CARE_PROVIDER_SITE_OTHER): Payer: BC Managed Care – PPO

## 2013-05-14 DIAGNOSIS — J309 Allergic rhinitis, unspecified: Secondary | ICD-10-CM

## 2013-05-22 ENCOUNTER — Ambulatory Visit (INDEPENDENT_AMBULATORY_CARE_PROVIDER_SITE_OTHER): Payer: BC Managed Care – PPO

## 2013-05-22 DIAGNOSIS — J309 Allergic rhinitis, unspecified: Secondary | ICD-10-CM

## 2013-05-29 ENCOUNTER — Ambulatory Visit: Payer: BC Managed Care – PPO

## 2013-06-05 ENCOUNTER — Ambulatory Visit (INDEPENDENT_AMBULATORY_CARE_PROVIDER_SITE_OTHER): Payer: BC Managed Care – PPO

## 2013-06-05 DIAGNOSIS — J309 Allergic rhinitis, unspecified: Secondary | ICD-10-CM

## 2013-06-12 ENCOUNTER — Ambulatory Visit: Payer: BC Managed Care – PPO

## 2013-06-19 ENCOUNTER — Ambulatory Visit (INDEPENDENT_AMBULATORY_CARE_PROVIDER_SITE_OTHER): Payer: BC Managed Care – PPO

## 2013-06-19 DIAGNOSIS — J309 Allergic rhinitis, unspecified: Secondary | ICD-10-CM

## 2013-06-26 ENCOUNTER — Ambulatory Visit (INDEPENDENT_AMBULATORY_CARE_PROVIDER_SITE_OTHER): Payer: BC Managed Care – PPO

## 2013-06-26 DIAGNOSIS — J309 Allergic rhinitis, unspecified: Secondary | ICD-10-CM

## 2013-07-03 ENCOUNTER — Ambulatory Visit: Payer: BC Managed Care – PPO

## 2013-07-10 ENCOUNTER — Ambulatory Visit (INDEPENDENT_AMBULATORY_CARE_PROVIDER_SITE_OTHER): Payer: BC Managed Care – PPO

## 2013-07-10 DIAGNOSIS — J309 Allergic rhinitis, unspecified: Secondary | ICD-10-CM

## 2013-07-17 ENCOUNTER — Ambulatory Visit (INDEPENDENT_AMBULATORY_CARE_PROVIDER_SITE_OTHER): Payer: BC Managed Care – PPO

## 2013-07-17 DIAGNOSIS — J309 Allergic rhinitis, unspecified: Secondary | ICD-10-CM

## 2013-07-24 ENCOUNTER — Ambulatory Visit (INDEPENDENT_AMBULATORY_CARE_PROVIDER_SITE_OTHER): Payer: BC Managed Care – PPO

## 2013-07-24 DIAGNOSIS — J309 Allergic rhinitis, unspecified: Secondary | ICD-10-CM

## 2013-07-31 ENCOUNTER — Ambulatory Visit (INDEPENDENT_AMBULATORY_CARE_PROVIDER_SITE_OTHER): Payer: BC Managed Care – PPO

## 2013-07-31 DIAGNOSIS — Z23 Encounter for immunization: Secondary | ICD-10-CM

## 2013-07-31 DIAGNOSIS — J309 Allergic rhinitis, unspecified: Secondary | ICD-10-CM

## 2013-08-07 ENCOUNTER — Ambulatory Visit (INDEPENDENT_AMBULATORY_CARE_PROVIDER_SITE_OTHER): Payer: BC Managed Care – PPO

## 2013-08-07 DIAGNOSIS — J309 Allergic rhinitis, unspecified: Secondary | ICD-10-CM

## 2013-08-14 ENCOUNTER — Ambulatory Visit: Payer: BC Managed Care – PPO

## 2013-08-21 ENCOUNTER — Ambulatory Visit (INDEPENDENT_AMBULATORY_CARE_PROVIDER_SITE_OTHER): Payer: BC Managed Care – PPO

## 2013-08-21 DIAGNOSIS — J309 Allergic rhinitis, unspecified: Secondary | ICD-10-CM

## 2013-08-28 ENCOUNTER — Ambulatory Visit: Payer: BC Managed Care – PPO

## 2013-09-01 ENCOUNTER — Ambulatory Visit (INDEPENDENT_AMBULATORY_CARE_PROVIDER_SITE_OTHER): Payer: BC Managed Care – PPO

## 2013-09-01 DIAGNOSIS — J309 Allergic rhinitis, unspecified: Secondary | ICD-10-CM

## 2013-09-11 ENCOUNTER — Ambulatory Visit (INDEPENDENT_AMBULATORY_CARE_PROVIDER_SITE_OTHER): Payer: BC Managed Care – PPO

## 2013-09-11 DIAGNOSIS — J309 Allergic rhinitis, unspecified: Secondary | ICD-10-CM

## 2013-09-14 ENCOUNTER — Ambulatory Visit (INDEPENDENT_AMBULATORY_CARE_PROVIDER_SITE_OTHER): Payer: BC Managed Care – PPO

## 2013-09-14 DIAGNOSIS — J309 Allergic rhinitis, unspecified: Secondary | ICD-10-CM

## 2013-09-18 ENCOUNTER — Ambulatory Visit (INDEPENDENT_AMBULATORY_CARE_PROVIDER_SITE_OTHER): Payer: BC Managed Care – PPO

## 2013-09-18 DIAGNOSIS — J309 Allergic rhinitis, unspecified: Secondary | ICD-10-CM

## 2013-09-25 ENCOUNTER — Ambulatory Visit (INDEPENDENT_AMBULATORY_CARE_PROVIDER_SITE_OTHER): Payer: BC Managed Care – PPO

## 2013-09-25 DIAGNOSIS — J309 Allergic rhinitis, unspecified: Secondary | ICD-10-CM

## 2013-10-02 ENCOUNTER — Ambulatory Visit: Payer: BC Managed Care – PPO

## 2013-10-09 ENCOUNTER — Ambulatory Visit (INDEPENDENT_AMBULATORY_CARE_PROVIDER_SITE_OTHER): Payer: BC Managed Care – PPO

## 2013-10-09 DIAGNOSIS — J309 Allergic rhinitis, unspecified: Secondary | ICD-10-CM

## 2013-10-16 ENCOUNTER — Ambulatory Visit (INDEPENDENT_AMBULATORY_CARE_PROVIDER_SITE_OTHER): Payer: BC Managed Care – PPO

## 2013-10-16 DIAGNOSIS — J309 Allergic rhinitis, unspecified: Secondary | ICD-10-CM

## 2013-10-23 ENCOUNTER — Ambulatory Visit (INDEPENDENT_AMBULATORY_CARE_PROVIDER_SITE_OTHER): Payer: BC Managed Care – PPO

## 2013-10-23 DIAGNOSIS — J309 Allergic rhinitis, unspecified: Secondary | ICD-10-CM

## 2013-10-30 ENCOUNTER — Ambulatory Visit: Payer: BC Managed Care – PPO

## 2013-11-06 ENCOUNTER — Ambulatory Visit (INDEPENDENT_AMBULATORY_CARE_PROVIDER_SITE_OTHER): Payer: BC Managed Care – PPO

## 2013-11-06 DIAGNOSIS — J309 Allergic rhinitis, unspecified: Secondary | ICD-10-CM

## 2013-11-13 ENCOUNTER — Ambulatory Visit (INDEPENDENT_AMBULATORY_CARE_PROVIDER_SITE_OTHER): Payer: BC Managed Care – PPO

## 2013-11-13 DIAGNOSIS — J309 Allergic rhinitis, unspecified: Secondary | ICD-10-CM

## 2013-11-20 ENCOUNTER — Ambulatory Visit (INDEPENDENT_AMBULATORY_CARE_PROVIDER_SITE_OTHER): Payer: BC Managed Care – PPO

## 2013-11-20 DIAGNOSIS — J309 Allergic rhinitis, unspecified: Secondary | ICD-10-CM

## 2013-11-26 ENCOUNTER — Encounter: Payer: Self-pay | Admitting: Internal Medicine

## 2013-11-27 ENCOUNTER — Ambulatory Visit (INDEPENDENT_AMBULATORY_CARE_PROVIDER_SITE_OTHER): Payer: BC Managed Care – PPO

## 2013-11-27 DIAGNOSIS — J309 Allergic rhinitis, unspecified: Secondary | ICD-10-CM

## 2013-12-04 ENCOUNTER — Ambulatory Visit: Payer: BC Managed Care – PPO

## 2013-12-11 ENCOUNTER — Ambulatory Visit (INDEPENDENT_AMBULATORY_CARE_PROVIDER_SITE_OTHER): Payer: BC Managed Care – PPO

## 2013-12-11 DIAGNOSIS — J309 Allergic rhinitis, unspecified: Secondary | ICD-10-CM

## 2013-12-18 ENCOUNTER — Ambulatory Visit: Payer: BC Managed Care – PPO

## 2013-12-25 ENCOUNTER — Ambulatory Visit (INDEPENDENT_AMBULATORY_CARE_PROVIDER_SITE_OTHER): Payer: BC Managed Care – PPO

## 2013-12-25 DIAGNOSIS — J309 Allergic rhinitis, unspecified: Secondary | ICD-10-CM

## 2014-01-01 ENCOUNTER — Ambulatory Visit: Payer: BC Managed Care – PPO

## 2014-01-08 ENCOUNTER — Ambulatory Visit (INDEPENDENT_AMBULATORY_CARE_PROVIDER_SITE_OTHER): Payer: BC Managed Care – PPO

## 2014-01-08 DIAGNOSIS — J309 Allergic rhinitis, unspecified: Secondary | ICD-10-CM

## 2014-01-15 ENCOUNTER — Ambulatory Visit (INDEPENDENT_AMBULATORY_CARE_PROVIDER_SITE_OTHER): Payer: BC Managed Care – PPO

## 2014-01-15 DIAGNOSIS — J309 Allergic rhinitis, unspecified: Secondary | ICD-10-CM

## 2014-01-22 ENCOUNTER — Ambulatory Visit (INDEPENDENT_AMBULATORY_CARE_PROVIDER_SITE_OTHER): Payer: BC Managed Care – PPO

## 2014-01-22 DIAGNOSIS — J309 Allergic rhinitis, unspecified: Secondary | ICD-10-CM

## 2014-01-29 ENCOUNTER — Ambulatory Visit (INDEPENDENT_AMBULATORY_CARE_PROVIDER_SITE_OTHER): Payer: BC Managed Care – PPO

## 2014-01-29 DIAGNOSIS — J309 Allergic rhinitis, unspecified: Secondary | ICD-10-CM

## 2014-02-04 ENCOUNTER — Ambulatory Visit (INDEPENDENT_AMBULATORY_CARE_PROVIDER_SITE_OTHER): Payer: BC Managed Care – PPO

## 2014-02-04 DIAGNOSIS — J309 Allergic rhinitis, unspecified: Secondary | ICD-10-CM

## 2014-02-12 ENCOUNTER — Ambulatory Visit (INDEPENDENT_AMBULATORY_CARE_PROVIDER_SITE_OTHER): Payer: BC Managed Care – PPO

## 2014-02-12 DIAGNOSIS — J309 Allergic rhinitis, unspecified: Secondary | ICD-10-CM

## 2014-02-19 ENCOUNTER — Ambulatory Visit (INDEPENDENT_AMBULATORY_CARE_PROVIDER_SITE_OTHER): Payer: BC Managed Care – PPO

## 2014-02-19 DIAGNOSIS — J309 Allergic rhinitis, unspecified: Secondary | ICD-10-CM

## 2014-02-23 ENCOUNTER — Ambulatory Visit (INDEPENDENT_AMBULATORY_CARE_PROVIDER_SITE_OTHER): Payer: BC Managed Care – PPO

## 2014-02-23 DIAGNOSIS — J309 Allergic rhinitis, unspecified: Secondary | ICD-10-CM

## 2014-02-26 ENCOUNTER — Ambulatory Visit (INDEPENDENT_AMBULATORY_CARE_PROVIDER_SITE_OTHER): Payer: BC Managed Care – PPO

## 2014-02-26 DIAGNOSIS — J309 Allergic rhinitis, unspecified: Secondary | ICD-10-CM

## 2014-03-05 ENCOUNTER — Ambulatory Visit (INDEPENDENT_AMBULATORY_CARE_PROVIDER_SITE_OTHER): Payer: BC Managed Care – PPO

## 2014-03-05 DIAGNOSIS — J309 Allergic rhinitis, unspecified: Secondary | ICD-10-CM

## 2014-03-12 ENCOUNTER — Ambulatory Visit (INDEPENDENT_AMBULATORY_CARE_PROVIDER_SITE_OTHER): Payer: BC Managed Care – PPO

## 2014-03-12 DIAGNOSIS — J309 Allergic rhinitis, unspecified: Secondary | ICD-10-CM

## 2014-03-19 ENCOUNTER — Ambulatory Visit (INDEPENDENT_AMBULATORY_CARE_PROVIDER_SITE_OTHER): Payer: BC Managed Care – PPO

## 2014-03-19 DIAGNOSIS — J309 Allergic rhinitis, unspecified: Secondary | ICD-10-CM

## 2014-03-26 ENCOUNTER — Ambulatory Visit: Payer: BC Managed Care – PPO

## 2014-04-02 ENCOUNTER — Ambulatory Visit (INDEPENDENT_AMBULATORY_CARE_PROVIDER_SITE_OTHER): Payer: BC Managed Care – PPO

## 2014-04-02 DIAGNOSIS — J309 Allergic rhinitis, unspecified: Secondary | ICD-10-CM

## 2014-04-06 ENCOUNTER — Encounter: Payer: Self-pay | Admitting: Internal Medicine

## 2014-04-09 ENCOUNTER — Ambulatory Visit: Payer: BC Managed Care – PPO

## 2014-04-16 ENCOUNTER — Ambulatory Visit (INDEPENDENT_AMBULATORY_CARE_PROVIDER_SITE_OTHER): Payer: BC Managed Care – PPO

## 2014-04-16 DIAGNOSIS — J309 Allergic rhinitis, unspecified: Secondary | ICD-10-CM

## 2014-04-23 ENCOUNTER — Ambulatory Visit: Payer: BC Managed Care – PPO

## 2014-04-30 ENCOUNTER — Ambulatory Visit (INDEPENDENT_AMBULATORY_CARE_PROVIDER_SITE_OTHER): Payer: BC Managed Care – PPO

## 2014-04-30 DIAGNOSIS — J309 Allergic rhinitis, unspecified: Secondary | ICD-10-CM

## 2014-05-14 ENCOUNTER — Ambulatory Visit (INDEPENDENT_AMBULATORY_CARE_PROVIDER_SITE_OTHER): Payer: BC Managed Care – PPO

## 2014-05-14 DIAGNOSIS — J309 Allergic rhinitis, unspecified: Secondary | ICD-10-CM

## 2014-05-21 ENCOUNTER — Ambulatory Visit: Payer: BC Managed Care – PPO

## 2014-05-26 DIAGNOSIS — R197 Diarrhea, unspecified: Secondary | ICD-10-CM | POA: Insufficient documentation

## 2014-05-26 DIAGNOSIS — R112 Nausea with vomiting, unspecified: Secondary | ICD-10-CM | POA: Insufficient documentation

## 2014-05-26 DIAGNOSIS — Y9289 Other specified places as the place of occurrence of the external cause: Secondary | ICD-10-CM | POA: Insufficient documentation

## 2014-05-26 DIAGNOSIS — S199XXA Unspecified injury of neck, initial encounter: Secondary | ICD-10-CM

## 2014-05-26 DIAGNOSIS — K219 Gastro-esophageal reflux disease without esophagitis: Secondary | ICD-10-CM | POA: Insufficient documentation

## 2014-05-26 DIAGNOSIS — S0993XA Unspecified injury of face, initial encounter: Secondary | ICD-10-CM | POA: Insufficient documentation

## 2014-05-26 DIAGNOSIS — Y9389 Activity, other specified: Secondary | ICD-10-CM | POA: Insufficient documentation

## 2014-05-26 DIAGNOSIS — W1809XA Striking against other object with subsequent fall, initial encounter: Secondary | ICD-10-CM | POA: Insufficient documentation

## 2014-05-26 DIAGNOSIS — Z79899 Other long term (current) drug therapy: Secondary | ICD-10-CM | POA: Insufficient documentation

## 2014-05-26 DIAGNOSIS — S0990XA Unspecified injury of head, initial encounter: Secondary | ICD-10-CM | POA: Insufficient documentation

## 2014-05-27 ENCOUNTER — Emergency Department (HOSPITAL_COMMUNITY): Payer: BC Managed Care – PPO

## 2014-05-27 ENCOUNTER — Encounter (HOSPITAL_COMMUNITY): Payer: Self-pay | Admitting: Emergency Medicine

## 2014-05-27 ENCOUNTER — Emergency Department (HOSPITAL_COMMUNITY)
Admission: EM | Admit: 2014-05-27 | Discharge: 2014-05-27 | Disposition: A | Discharge status: Home / Self Care | Payer: BC Managed Care – PPO | Attending: Emergency Medicine | Admitting: Emergency Medicine

## 2014-05-27 DIAGNOSIS — R112 Nausea with vomiting, unspecified: Secondary | ICD-10-CM

## 2014-05-27 DIAGNOSIS — R197 Diarrhea, unspecified: Secondary | ICD-10-CM

## 2014-05-27 LAB — URINALYSIS, ROUTINE W REFLEX MICROSCOPIC
Bilirubin Urine: NEGATIVE
GLUCOSE, UA: NEGATIVE mg/dL
Hgb urine dipstick: NEGATIVE
KETONES UR: NEGATIVE mg/dL
LEUKOCYTES UA: NEGATIVE
Nitrite: NEGATIVE
PROTEIN: NEGATIVE mg/dL
Specific Gravity, Urine: 1.029 (ref 1.005–1.030)
Urobilinogen, UA: 1 mg/dL (ref 0.0–1.0)
pH: 5.5 (ref 5.0–8.0)

## 2014-05-27 LAB — COMPREHENSIVE METABOLIC PANEL
ALT: 24 U/L (ref 0–53)
AST: 21 U/L (ref 0–37)
Albumin: 4.5 g/dL (ref 3.5–5.2)
Alkaline Phosphatase: 89 U/L (ref 39–117)
Anion gap: 17 — ABNORMAL HIGH (ref 5–15)
BUN: 20 mg/dL (ref 6–23)
CALCIUM: 9.6 mg/dL (ref 8.4–10.5)
CO2: 24 meq/L (ref 19–32)
CREATININE: 0.96 mg/dL (ref 0.50–1.35)
Chloride: 100 mEq/L (ref 96–112)
GLUCOSE: 116 mg/dL — AB (ref 70–99)
Potassium: 3.8 mEq/L (ref 3.7–5.3)
Sodium: 141 mEq/L (ref 137–147)
Total Bilirubin: 0.7 mg/dL (ref 0.3–1.2)
Total Protein: 7.9 g/dL (ref 6.0–8.3)

## 2014-05-27 LAB — CBC WITH DIFFERENTIAL/PLATELET
Basophils Absolute: 0 10*3/uL (ref 0.0–0.1)
Basophils Relative: 0 % (ref 0–1)
EOS PCT: 2 % (ref 0–5)
Eosinophils Absolute: 0.2 10*3/uL (ref 0.0–0.7)
HEMATOCRIT: 47.1 % (ref 39.0–52.0)
Hemoglobin: 17 g/dL (ref 13.0–17.0)
LYMPHS ABS: 1.8 10*3/uL (ref 0.7–4.0)
LYMPHS PCT: 18 % (ref 12–46)
MCH: 29.1 pg (ref 26.0–34.0)
MCHC: 36.1 g/dL — ABNORMAL HIGH (ref 30.0–36.0)
MCV: 80.5 fL (ref 78.0–100.0)
MONO ABS: 0.7 10*3/uL (ref 0.1–1.0)
MONOS PCT: 7 % (ref 3–12)
Neutro Abs: 7.2 10*3/uL (ref 1.7–7.7)
Neutrophils Relative %: 73 % (ref 43–77)
Platelets: 181 10*3/uL (ref 150–400)
RBC: 5.85 MIL/uL — AB (ref 4.22–5.81)
RDW: 12.9 % (ref 11.5–15.5)
WBC: 9.9 10*3/uL (ref 4.0–10.5)

## 2014-05-27 LAB — LIPASE, BLOOD: Lipase: 16 U/L (ref 11–59)

## 2014-05-27 MED ORDER — ONDANSETRON 8 MG PO TBDP: 8.0000 mg | ORAL_TABLET | Freq: Three times a day (TID) | ORAL | Status: AC | PRN

## 2014-05-27 MED ORDER — ONDANSETRON HCL 4 MG/2ML IJ SOLN
4.0000 mg | Freq: Once | INTRAMUSCULAR | Status: AC
  Administered 2014-05-27: 4 mg via INTRAVENOUS
  Filled 2014-05-27: qty 2

## 2014-05-27 NOTE — ED Notes (Signed)
Pt is aware of the need for urine sample.

## 2014-05-27 NOTE — ED Notes (Signed)
Pt ambulated to the restroom in attempt to provide a urine sample.

## 2014-05-27 NOTE — ED Notes (Signed)
Pt sts he fell and hit his head this past Saturday. Pt began having nausea/vomiting today as well as diarrhea this morning. Pt concerned about concussion. Pt denies pain unless touching head. Pt sts vomiting was projectile. Pt sts "I just feel terrible all over." A&Ox4.

## 2014-05-27 NOTE — ED Notes (Signed)
Patient transported to CT 

## 2014-05-27 NOTE — ED Notes (Signed)
Pt states that on Saturday afternoon he slipped and fell [place] a water park hitting his head. Denies LOC. Pt states he woke up this morning "not feeling well at all." Started vomiting around 1300.

## 2014-05-27 NOTE — Discharge Instructions (Signed)
Food Choices to Help Relieve Diarrhea When you have diarrhea, the foods you eat and your eating habits are very important. Choosing the right foods and drinks can help relieve diarrhea. Also, because diarrhea can last up to 7 days, you need to replace lost fluids and electrolytes (such as sodium, potassium, and chloride) in order to help prevent dehydration.  WHAT GENERAL GUIDELINES DO I NEED TO FOLLOW?  Slowly drink 1 cup (8 oz) of fluid for each episode of diarrhea. If you are getting enough fluid, your urine will be clear or pale yellow.  Eat starchy foods. Some good choices include white rice, white toast, pasta, low-fiber cereal, baked potatoes (without the skin), saltine crackers, and bagels.  Avoid large servings of any cooked vegetables.  Limit fruit to two servings per day. A serving is  cup or 1 small piece.  Choose foods with less than 2 g of fiber per serving.  Limit fats to less than 8 tsp (38 g) per day.  Avoid fried foods.  Eat foods that have probiotics in them. Probiotics can be found in certain dairy products.  Avoid foods and beverages that may increase the speed at which food moves through the stomach and intestines (gastrointestinal tract). Things to avoid include:  High-fiber foods, such as dried fruit, raw fruits and vegetables, nuts, seeds, and whole grain foods.  Spicy foods and high-fat foods.  Foods and beverages sweetened with high-fructose corn syrup, honey, or sugar alcohols such as xylitol, sorbitol, and mannitol. WHAT FOODS ARE RECOMMENDED? Grains White rice. White, [place], or pita breads (fresh or toasted), including plain rolls, buns, or bagels. White pasta. Saltine, soda, or graham crackers. Pretzels. Low-fiber cereal. Cooked cereals made with water (such as cornmeal, farina, or cream cereals). Plain muffins. Matzo. Melba toast. Zwieback.  Vegetables Potatoes (without the skin). Strained tomato and vegetable juices. Most well-cooked and canned  vegetables without seeds. Tender lettuce. Fruits Cooked or canned applesauce, apricots, cherries, fruit cocktail, grapefruit, peaches, pears, or plums. Fresh bananas, apples without skin, cherries, grapes, cantaloupe, grapefruit, peaches, oranges, or plums.  Meat and Other Protein Products Baked or boiled chicken. Eggs. Tofu. Fish. Seafood. Smooth peanut butter. Ground or well-cooked tender beef, ham, veal, lamb, pork, or poultry.  Dairy Plain yogurt, kefir, and unsweetened liquid yogurt. Lactose-free milk, buttermilk, or soy milk. Plain hard cheese. Beverages Sport drinks. Clear broths. Diluted fruit juices (except prune). Regular, caffeine-free sodas such as ginger ale. Water. Decaffeinated teas. Oral rehydration solutions. Sugar-free beverages not sweetened with sugar alcohols. Other Bouillon, broth, or soups made from recommended foods.  The items listed above may not be a complete list of recommended foods or beverages. Contact your dietitian for more options. WHAT FOODS ARE NOT RECOMMENDED? Grains Whole grain, whole wheat, bran, or rye breads, rolls, pastas, crackers, and cereals. Wild or brown rice. Cereals that contain more than 2 g of fiber per serving. Corn tortillas or taco shells. Cooked or dry oatmeal. Granola. Popcorn. Vegetables Raw vegetables. Cabbage, broccoli, Brussels sprouts, artichokes, baked beans, beet greens, corn, kale, legumes, peas, sweet potatoes, and yams. Potato skins. Cooked spinach and cabbage. Fruits Dried fruit, including raisins and dates. Raw fruits. Stewed or dried prunes. Fresh apples with skin, apricots, mangoes, pears, raspberries, and strawberries.  Meat and Other Protein Products Chunky peanut butter. Nuts and seeds. Beans and lentils. Berniece Salines.  Dairy High-fat cheeses. Milk, chocolate milk, and beverages made with milk, such as milk shakes. Cream. Ice cream. Sweets and Desserts Sweet rolls, doughnuts, and sweet breads. Pancakes  and waffles. Fats and  Oils Butter. Cream sauces. Margarine. Salad oils. Plain salad dressings. Olives. Avocados.  Beverages Caffeinated beverages (such as coffee, tea, soda, or energy drinks). Alcoholic beverages. Fruit juices with pulp. Prune juice. Soft drinks sweetened with high-fructose corn syrup or sugar alcohols. Other Coconut. Hot sauce. Chili powder. Mayonnaise. Gravy. Cream-based or milk-based soups.  The items listed above may not be a complete list of foods and beverages to avoid. Contact your dietitian for more information. WHAT SHOULD I DO IF I BECOME DEHYDRATED? Diarrhea can sometimes lead to dehydration. Signs of dehydration include dark urine and dry mouth and skin. If you think you are dehydrated, you should rehydrate with an oral rehydration solution. These solutions can be purchased at pharmacies, retail stores, or online.  Drink -1 cup (120-240 mL) of oral rehydration solution each time you have an episode of diarrhea. If drinking this amount makes your diarrhea worse, try drinking smaller amounts more often. For example, drink 1-3 tsp (5-15 mL) every 5-10 minutes.  A general rule for staying hydrated is to drink 1-2 L of fluid per day. Talk to your health care provider about the specific amount you should be drinking each day. Drink enough fluids to keep your urine clear or pale yellow. Document Released: 01/12/2004 Document Revised: 10/27/2013 Document Reviewed: 09/14/2013 [facility] Patient Information 2015 [employer], [place]. This information is not intended to replace advice given to you by your health care provider. Make sure you discuss any questions you have with your health care provider.  Nausea and Vomiting Nausea is a sick feeling that often comes before throwing up (vomiting). Vomiting is a reflex where stomach contents come out of your mouth. Vomiting can cause severe loss of body fluids (dehydration). Children and elderly adults can become dehydrated quickly, especially if they also have  diarrhea. Nausea and vomiting are symptoms of a condition or disease. It is important to find the cause of your symptoms. CAUSES   Direct irritation of the stomach lining. This irritation can result from increased acid production (gastroesophageal reflux disease), infection, food poisoning, taking certain medicines (such as nonsteroidal anti-inflammatory drugs), alcohol use, or tobacco use.  Signals from the brain.These signals could be caused by a headache, heat exposure, an inner ear disturbance, increased pressure in the brain from injury, infection, a tumor, or a concussion, pain, emotional stimulus, or metabolic problems.  An obstruction in the gastrointestinal tract (bowel obstruction).  Illnesses such as diabetes, hepatitis, gallbladder problems, appendicitis, kidney problems, cancer, sepsis, atypical symptoms of a heart attack, or eating disorders.  Medical treatments such as chemotherapy and radiation.  Receiving medicine that makes you sleep (general anesthetic) during surgery. DIAGNOSIS Your caregiver may ask for tests to be done if the problems do not improve after a few days. Tests may also be done if symptoms are severe or if the reason for the nausea and vomiting is not clear. Tests may include:  Urine tests.  Blood tests.  Stool tests.  Cultures (to look for evidence of infection).  X-rays or other imaging studies. Test results can help your caregiver make decisions about treatment or the need for additional tests. TREATMENT You need to stay well hydrated. Drink frequently but in small amounts.You may wish to drink water, sports drinks, clear broth, or eat frozen ice pops or gelatin dessert to help stay hydrated.When you eat, eating slowly may help prevent nausea.There are also some antinausea medicines that may help prevent nausea. HOME CARE INSTRUCTIONS   Take all medicine as  directed by your caregiver.  If you do not have an appetite, do not force yourself to  eat. However, you must continue to drink fluids.  If you have an appetite, eat a normal diet unless your caregiver tells you differently.  Eat a variety of complex carbohydrates (rice, wheat, potatoes, bread), lean meats, yogurt, fruits, and vegetables.  Avoid high-fat foods because they are more difficult to digest.  Drink enough water and fluids to keep your urine clear or pale yellow.  If you are dehydrated, ask your caregiver for specific rehydration instructions. Signs of dehydration may include:  Severe thirst.  Dry lips and mouth.  Dizziness.  Dark urine.  Decreasing urine frequency and amount.  Confusion.  Rapid breathing or pulse. SEEK IMMEDIATE MEDICAL CARE IF:   You have blood or brown flecks (like coffee grounds) in your vomit.  You have black or bloody stools.  You have a severe headache or stiff neck.  You are confused.  You have severe abdominal pain.  You have chest pain or trouble breathing.  You do not urinate at least once every 8 hours.  You develop cold or clammy skin.  You continue to vomit for longer than 24 to 48 hours.  You have a fever. MAKE SURE YOU:   Understand these instructions.  Will watch your condition.  Will get help right away if you are not doing well or get worse. Document Released: 10/22/2005 Document Revised: 01/14/2012 Document Reviewed: 03/21/2011 [facility] Patient Information 2015 [place], [place]. This information is not intended to replace advice given to you by your health care provider. Make sure you discuss any questions you have with your health care provider.

## 2014-05-27 NOTE — ED Provider Notes (Signed)
CSN: [id number]     Arrival date & time 05/26/14  2358 History   First MD Initiated Contact with Patient 05/27/14 0041     Chief Complaint  Patient presents with  . Emesis     (Consider location/radiation/quality/duration/timing/severity/associated sxs/prior Treatment) HPI 43 year old male presents to emergency department with nausea vomiting and diarrhea.  Symptoms started this morning.  Patient reports multiple episodes of projectile vomiting, loose stools.  Patient returning from vacation in [place].  He reports head injury on Saturday, 5 days ago.  Patient reports he slipped on a wet pool deck and smacked the back of his head.  He had no LOC or vomiting at that time.  He denies any irritability, confusion or other postconcussive symptoms.  He has been told by his mother and mother-in-law who is a Marine scientist and his wife that he needs to get evaluated for this vomiting.  The entire family is concerned that he may have a head bleed that has gone undiagnosed.  He denies any focal weakness numbness dizziness.  He reports since receiving Zofran here, he is feeling much better.  He reports he is having some mild neck pain over the last few days, but it is better today. Past Medical History  Diagnosis Date  . Allergic rhinitis     immunotherapy, skin tesst POS 04-25-09  . Esophageal stricture   . GERD (gastroesophageal reflux disease)    Past Surgical History  Procedure Laterality Date  . Wisdom tooth extraction     Family History  Problem Relation Age of Onset  . Allergic rhinitis Father   . Breast cancer Mother    History  Substance Use Topics  . Smoking status: Never Smoker   . Smokeless tobacco: Former Systems developer    Types: Chew    Quit date: 11/06/2007  . Alcohol Use: Yes     Comment: socially    Review of Systems   See History of Present Illness; otherwise all other systems are reviewed and negative  Allergies  Review of patient's allergies indicates no known allergies.  Home  Medications   Prior to Admission medications   Medication Sig Start Date End Date Taking? Authorizing Provider  atorvastatin (LIPITOR) 10 MG tablet Take 10 mg by mouth daily.   Yes Historical Provider, MD  omeprazole (PRILOSEC) 20 MG capsule Take 1 capsule (20 mg total) by mouth daily. 01/05/13  Yes Ladene Artist, MD  fexofenadine (ALLEGRA) 180 MG tablet Take 180 mg by mouth daily as needed for allergies.     Historical Provider, MD  fluticasone (FLONASE) 50 MCG/ACT nasal spray Place 2 sprays into the nose as needed. 03/08/11 01/05/13  Deneise Lever, MD  levocetirizine (XYZAL) 5 MG tablet Take 5 mg by mouth daily as needed for allergies.     Historical Provider, MD   BP 126/75  Pulse 91  Temp(Src) 98.4 F (36.9 C) (Oral)  Resp 20  Ht 5' 9"  (1.753 m)  Wt 220 lb (99.791 kg)  BMI 32.47 kg/m2  SpO2 98% Physical Exam  Nursing note and vitals reviewed. Constitutional: He is oriented to person, place, and time. He appears well-developed and well-nourished.  HENT:  Head: Normocephalic and atraumatic.  Right Ear: External ear normal.  Left Ear: External ear normal.  Nose: Nose normal.  Mouth/Throat: Oropharynx is clear and moist.  Eyes: Conjunctivae and EOM are normal. Pupils are equal, round, and reactive to light.  Neck: Normal range of motion. Neck supple. No JVD present. No tracheal deviation present. No  thyromegaly present.  Cardiovascular: Normal rate, regular rhythm, normal heart sounds and intact distal pulses.  Exam reveals no gallop and no friction rub.   No murmur heard. Pulmonary/Chest: Effort normal and breath sounds normal. No stridor. No respiratory distress. He has no wheezes. He has no rales. He exhibits no tenderness.  Abdominal: Soft. Bowel sounds are normal. He exhibits no distension and no mass. There is no tenderness. There is no rebound and no guarding.  Musculoskeletal: Normal range of motion. He exhibits no edema and no tenderness.  Lymphadenopathy:    He has no  cervical adenopathy.  Neurological: He is alert and oriented to person, place, and time. He has normal reflexes. No cranial nerve deficit. He exhibits normal muscle tone. Coordination normal.  Skin: Skin is warm and dry. No rash noted. No erythema. No pallor.  Psychiatric: He has a normal mood and affect. His behavior is normal. Judgment and thought content normal.    ED Course  Procedures (including critical care time) Labs Review Labs Reviewed  CBC WITH DIFFERENTIAL - Abnormal; Notable for the following:    RBC 5.85 (*)    MCHC 36.1 (*)    All other components within normal limits  COMPREHENSIVE METABOLIC PANEL - Abnormal; Notable for the following:    Glucose, Bld 116 (*)    Anion gap 17 (*)    All other components within normal limits  LIPASE, BLOOD  URINALYSIS, ROUTINE W REFLEX MICROSCOPIC    Imaging Review Ct Head Wo Contrast  05/27/2014   CLINICAL DATA:  Posterior headache with vomiting since yesterday. Patient fell 5 days ago.  EXAM: CT HEAD WITHOUT CONTRAST  CT CERVICAL SPINE WITHOUT CONTRAST  TECHNIQUE: Multidetector CT imaging of the head and cervical spine was performed following the standard protocol without intravenous contrast. Multiplanar CT image reconstructions of the cervical spine were also generated.  COMPARISON:  None.  FINDINGS: CT HEAD FINDINGS  There is no evidence of acute intracranial hemorrhage, mass lesion, brain edema or extra-axial fluid collection. The ventricles and subarachnoid spaces are appropriately sized for age. There is no CT evidence of acute cortical infarction.  The visualized paranasal sinuses, mastoid air cells and middle ears are clear. The calvarium is intact.  CT CERVICAL SPINE FINDINGS  The cervical alignment is normal. The disc spaces are preserved. There is no evidence acute fracture or traumatic subluxation. No acute soft tissue findings are demonstrated. There is no significant osseous foraminal stenosis.  IMPRESSION: 1. No acute  intracranial or calvarial findings. 2. Negative CT of the cervical spine.   Electronically Signed   By: Camie Patience M.D.   On: 05/27/2014 02:01   Ct Cervical Spine Wo Contrast  05/27/2014   CLINICAL DATA:  Posterior headache with vomiting since yesterday. Patient fell 5 days ago.  EXAM: CT HEAD WITHOUT CONTRAST  CT CERVICAL SPINE WITHOUT CONTRAST  TECHNIQUE: Multidetector CT imaging of the head and cervical spine was performed following the standard protocol without intravenous contrast. Multiplanar CT image reconstructions of the cervical spine were also generated.  COMPARISON:  None.  FINDINGS: CT HEAD FINDINGS  There is no evidence of acute intracranial hemorrhage, mass lesion, brain edema or extra-axial fluid collection. The ventricles and subarachnoid spaces are appropriately sized for age. There is no CT evidence of acute cortical infarction.  The visualized paranasal sinuses, mastoid air cells and middle ears are clear. The calvarium is intact.  CT CERVICAL SPINE FINDINGS  The cervical alignment is normal. The disc spaces are preserved. There  is no evidence acute fracture or traumatic subluxation. No acute soft tissue findings are demonstrated. There is no significant osseous foraminal stenosis.  IMPRESSION: 1. No acute intracranial or calvarial findings. 2. Negative CT of the cervical spine.   Electronically Signed   By: Camie Patience M.D.   On: 05/27/2014 02:01     EKG Interpretation None      MDM   Final diagnoses:  Nausea vomiting and diarrhea    43 year old male with nausea vomiting diarrhea starting today 5 days after head injury.  I doubt that he has any serious intracranial injury, but he and the family is quite concerned.  I suspect that he has a gastroenteritis.  Plan for labs, hydration, and we'll check head and C-spine for reassurance.   Kalman Drape, MD 05/27/14 [phone]

## 2014-06-02 ENCOUNTER — Ambulatory Visit (INDEPENDENT_AMBULATORY_CARE_PROVIDER_SITE_OTHER): Payer: BC Managed Care – PPO

## 2014-06-02 ENCOUNTER — Encounter: Payer: Self-pay | Admitting: Internal Medicine

## 2014-06-02 ENCOUNTER — Ambulatory Visit (INDEPENDENT_AMBULATORY_CARE_PROVIDER_SITE_OTHER): Payer: BC Managed Care – PPO | Admitting: Internal Medicine

## 2014-06-02 VITALS — BP 126/76 | HR 57 | Ht 68.0 in | Wt 218.2 lb

## 2014-06-02 DIAGNOSIS — K219 Gastro-esophageal reflux disease without esophagitis: Secondary | ICD-10-CM

## 2014-06-02 DIAGNOSIS — J301 Allergic rhinitis due to pollen: Secondary | ICD-10-CM

## 2014-06-02 DIAGNOSIS — J309 Allergic rhinitis, unspecified: Secondary | ICD-10-CM

## 2014-06-02 NOTE — Assessment & Plan Note (Signed)
controlled 

## 2014-06-02 NOTE — Patient Instructions (Addendum)
Ok to use up current allergy vaccine supply, then stop to see how you do over time.Madaline Brilliant to use an otc antihistamine like claritin or allegra if needed  Flonase is over the counter to use as before, while needed  Please call if we can help

## 2014-06-02 NOTE — Progress Notes (Signed)
06/02/14- 66 yoM never smoker FOLLOWS FOR: still on  Allergy vaccine 1:10 GH and doing well; feels full in throat. He took some Allegra during the spring and avoided sinus infection. No asthma or wheezing in a long time. Spring season is usually worst for him. In the last few days his son had a cold and today he notices incidental throat discomfort but does not feel sick. We talked about his experience with allergy vaccine and opportunity to stop now for reassessment.  ROS-see HPI Constitutional:   No-   weight loss, night sweats, fevers, chills, fatigue, lassitude. HEENT:   No-  headaches, difficulty swallowing, tooth/dental problems, sore throat,       No-  sneezing, itching, ear ache, nasal congestion, post nasal drip,  CV:  No-   chest pain, orthopnea, PND, swelling in lower extremities, anasarca,                                  dizziness, palpitations Resp: No-   shortness of breath with exertion or at rest.              No-   productive cough,  No non-productive cough,  No- coughing up of blood.              No-   change in color of mucus.  No- wheezing.   Skin: No-   rash or lesions. GI:  No-   heartburn, indigestion, abdominal pain, nausea, vomiting,  GU:  MS:  No-   joint pain or swelling.   Neuro-     nothing unusual Psych:  No- change in mood or affect. No depression or anxiety.  No memory loss.  OBJ- Physical Exam General- Alert, Oriented, Affect-appropriate, Distress- none acute Skin- rash-none, lesions- none, excoriation- none Lymphadenopathy- none Head- atraumatic            Eyes- Gross vision intact, PERRLA, conjunctivae and secretions clear            Ears- Hearing, canals-normal            Nose- Clear, no-Septal dev, mucus, polyps, erosion, perforation             Throat- Mallampati III, mucosa clear , drainage- none, tonsils- atrophic Neck- flexible , trachea midline, no stridor , thyroid nl, carotid no bruit Chest - symmetrical excursion , unlabored  Heart/CV- RRR , no murmur , no gallop  , no rub, nl s1 s2                           - JVD- none , edema- none, stasis changes- none, varices- none           Lung- clear to P&A, wheeze- none, cough- none , dullness-none, rub- none           Chest wall-  Abd-  Br/ Gen/ Rectal- Not done, not indicated Extrem- cyanosis- none, clubbing, none, atrophy- none, strength- nl Neuro- grossly intact to observation

## 2014-06-02 NOTE — Assessment & Plan Note (Signed)
He has had good experience with allergy shots. He has been on shots long enough to justify trying off.  Plan- stop allergy vaccine when current supply ends. Antihistamine if needed.

## 2014-06-18 ENCOUNTER — Ambulatory Visit (INDEPENDENT_AMBULATORY_CARE_PROVIDER_SITE_OTHER): Payer: BC Managed Care – PPO

## 2014-06-18 DIAGNOSIS — J309 Allergic rhinitis, unspecified: Secondary | ICD-10-CM

## 2014-06-25 ENCOUNTER — Ambulatory Visit: Payer: BC Managed Care – PPO

## 2014-07-02 ENCOUNTER — Ambulatory Visit (INDEPENDENT_AMBULATORY_CARE_PROVIDER_SITE_OTHER): Payer: BC Managed Care – PPO

## 2014-07-02 DIAGNOSIS — J309 Allergic rhinitis, unspecified: Secondary | ICD-10-CM

## 2014-07-05 ENCOUNTER — Ambulatory Visit (INDEPENDENT_AMBULATORY_CARE_PROVIDER_SITE_OTHER): Payer: BC Managed Care – PPO

## 2014-07-05 DIAGNOSIS — J309 Allergic rhinitis, unspecified: Secondary | ICD-10-CM

## 2014-07-23 ENCOUNTER — Ambulatory Visit (INDEPENDENT_AMBULATORY_CARE_PROVIDER_SITE_OTHER): Payer: BC Managed Care – PPO

## 2014-07-23 DIAGNOSIS — J309 Allergic rhinitis, unspecified: Secondary | ICD-10-CM

## 2014-08-05 ENCOUNTER — Ambulatory Visit (INDEPENDENT_AMBULATORY_CARE_PROVIDER_SITE_OTHER): Payer: BC Managed Care – PPO

## 2014-08-05 DIAGNOSIS — J309 Allergic rhinitis, unspecified: Secondary | ICD-10-CM

## 2014-08-05 DIAGNOSIS — Z23 Encounter for immunization: Secondary | ICD-10-CM

## 2014-08-06 ENCOUNTER — Ambulatory Visit: Payer: BC Managed Care – PPO

## 2014-08-20 ENCOUNTER — Ambulatory Visit (INDEPENDENT_AMBULATORY_CARE_PROVIDER_SITE_OTHER): Payer: BC Managed Care – PPO

## 2014-08-20 DIAGNOSIS — J309 Allergic rhinitis, unspecified: Secondary | ICD-10-CM

## 2014-09-03 ENCOUNTER — Ambulatory Visit (INDEPENDENT_AMBULATORY_CARE_PROVIDER_SITE_OTHER): Payer: BC Managed Care – PPO

## 2014-09-03 DIAGNOSIS — J309 Allergic rhinitis, unspecified: Secondary | ICD-10-CM

## 2014-09-17 ENCOUNTER — Encounter: Payer: Self-pay | Admitting: Internal Medicine

## 2014-12-10 IMAGING — CT CT HEAD W/O CM
4 series · 17 of 30 positions shown, 19 images · non-contrast
Comparison: None.

CLINICAL DATA: Posterior headache with vomiting since yesterday.
Patient fell 5 days ago.

EXAM:
CT HEAD WITHOUT CONTRAST
CT CERVICAL SPINE WITHOUT CONTRAST
TECHNIQUE: Multidetector CT imaging of the head and cervical spine was
performed following the standard protocol without intravenous
contrast. Multiplanar CT image reconstructions of the cervical spine
were also generated.

[Series 3: head w/o · axial · non-contrast · 0.43mm/px · z∈[-101,-46]mm · 2 of 33 slices shown]
[im 11/33  brain]
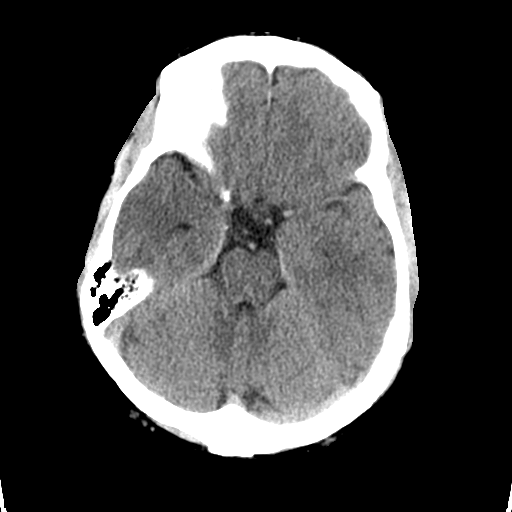
[im 22/33  brain]
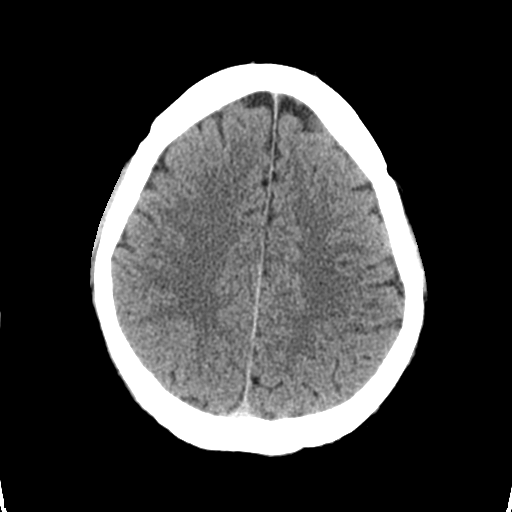

[Series 4: bone windows · axial · 0.43mm/px · z∈[-121,-25]mm · 4 of 54 slices shown]
[im 11/54  bone]
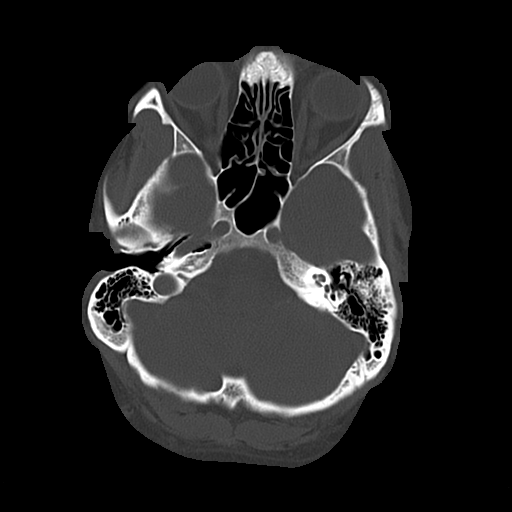
[im 22/54  bone]
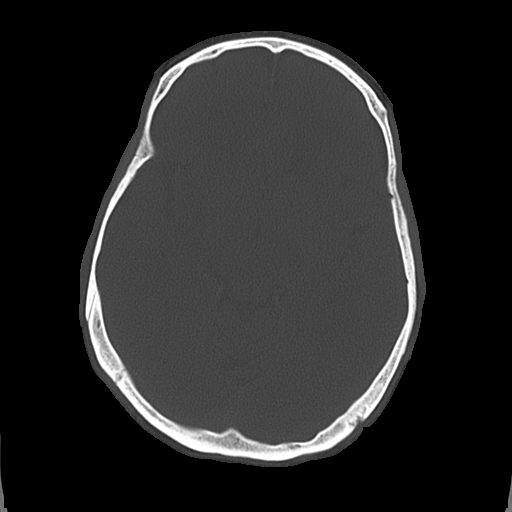
[im 32/54  bone]
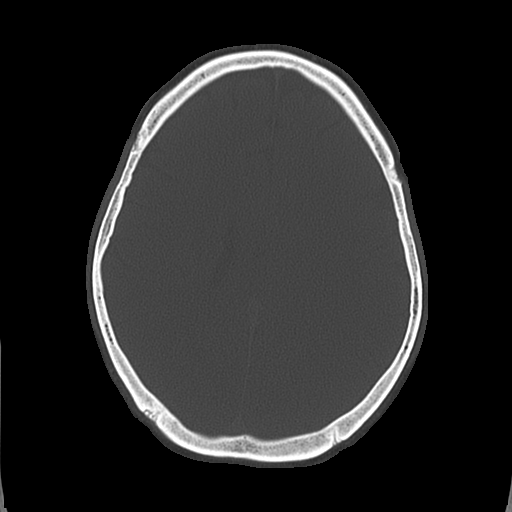
[im 43/54  bone]
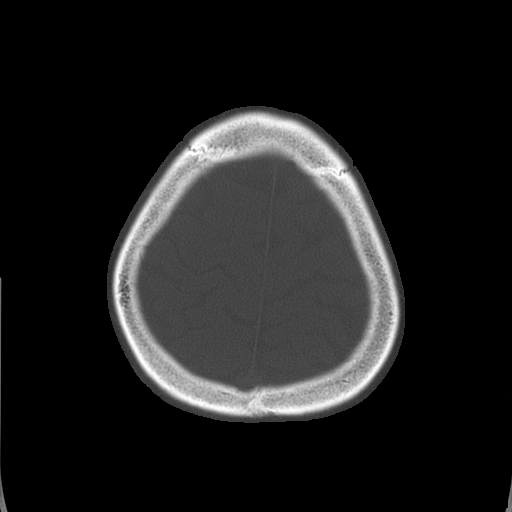

[Series 5: c-spine st · axial · 0.25mm/px · z∈[-294,-254]mm · 3 of 88 slices shown]
[im 10/88  brain]
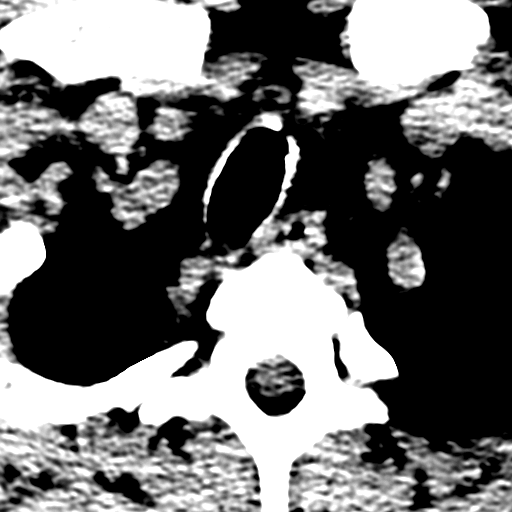
[im 20/88  brain]
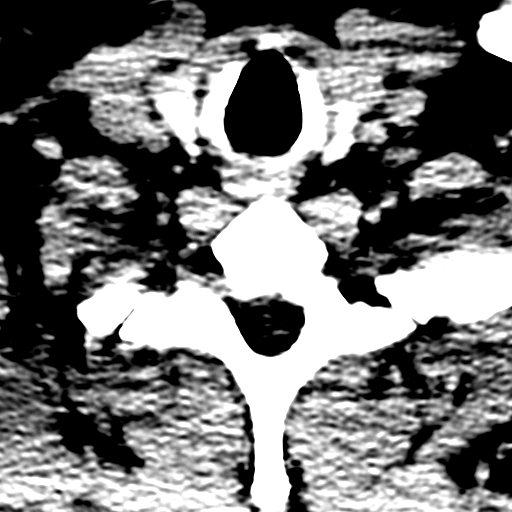
[im 30/88  brain]
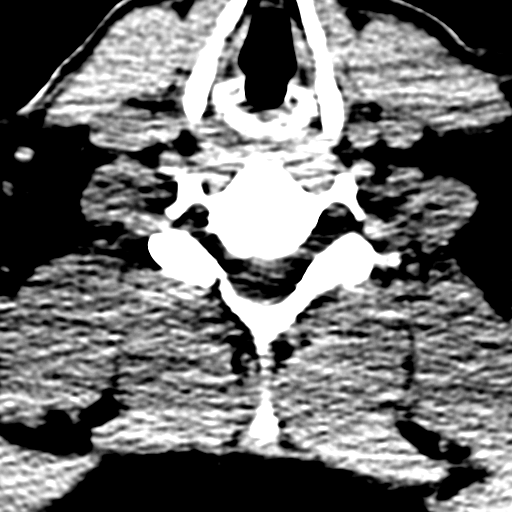

[Series 7: axial recon · axial · 0.22mm/px · z∈[-306,-175]mm · 8 of 92 slices shown, 10 images]
[im 11/92  brain]
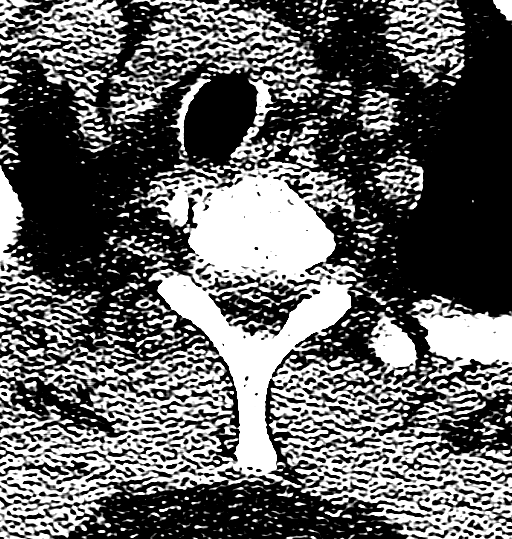
[im 11/92  bone]
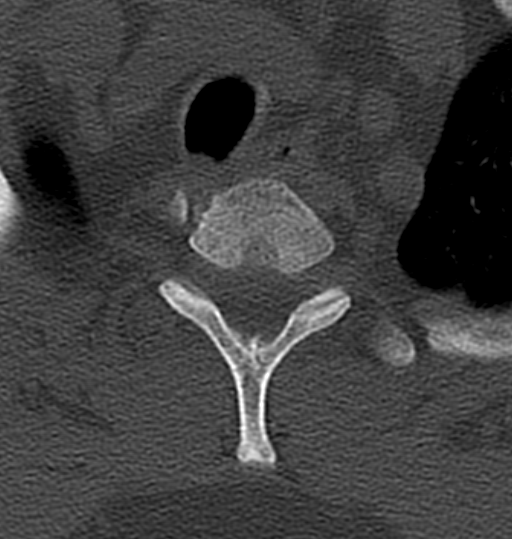
[im 21/92  brain]
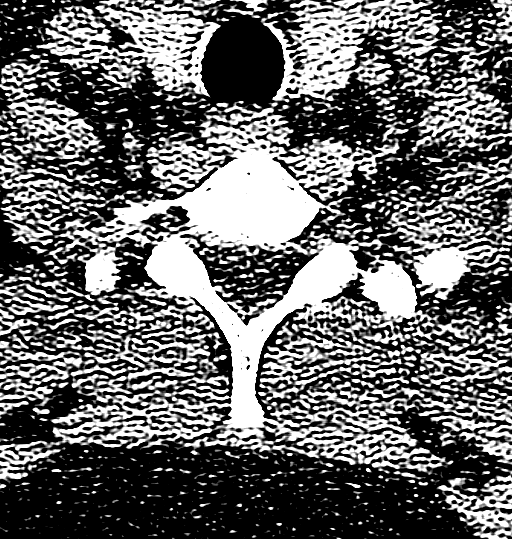
[im 31/92  brain]
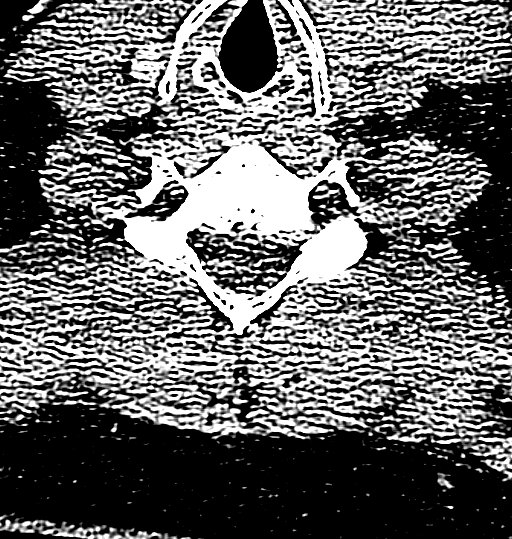
[im 41/92  brain]
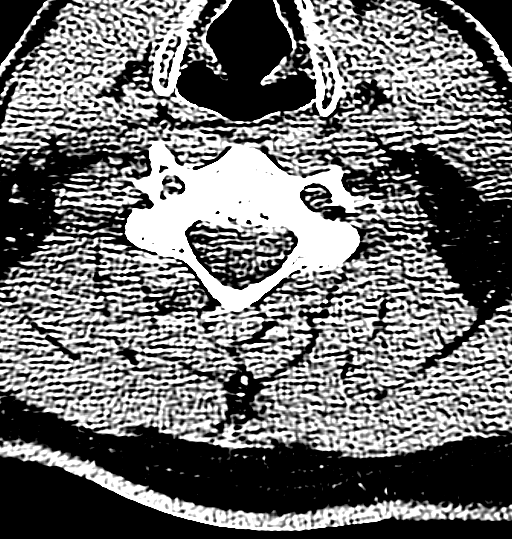
[im 51/92  brain]
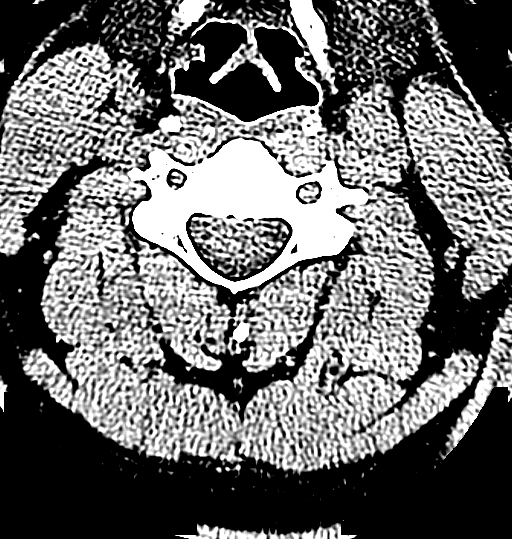
[im 51/92  bone]
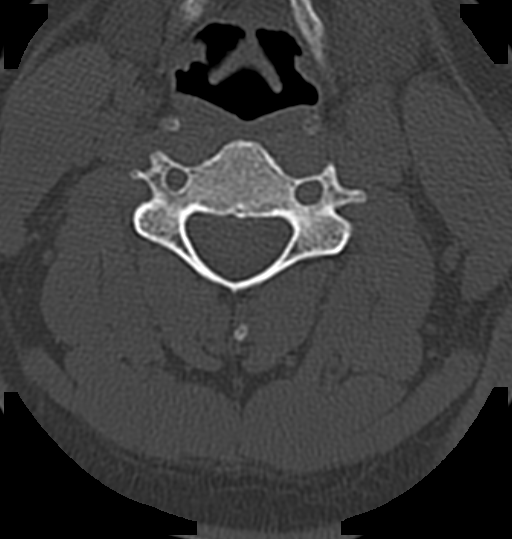
[im 61/92  brain]
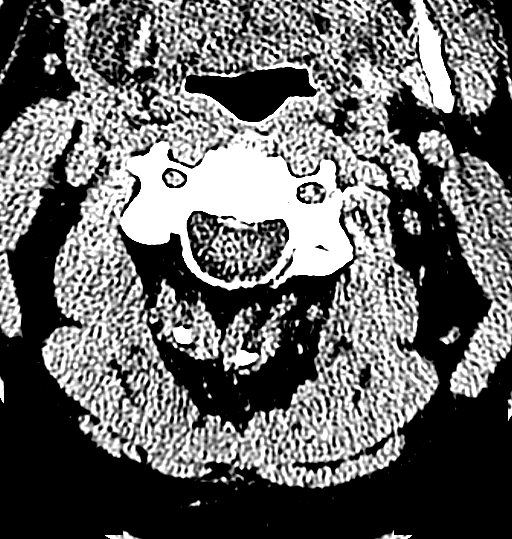
[im 71/92  brain]
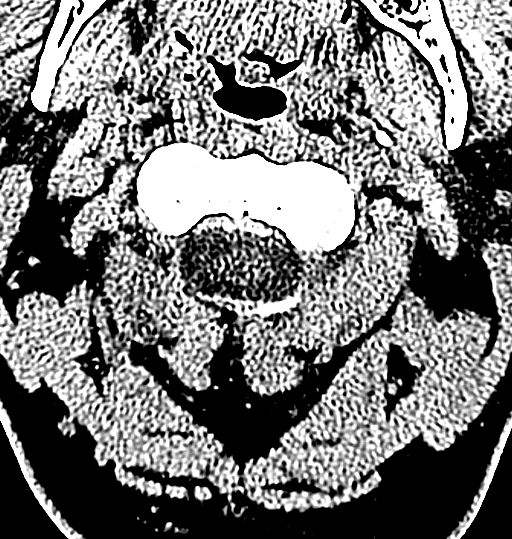
[im 81/92  brain]
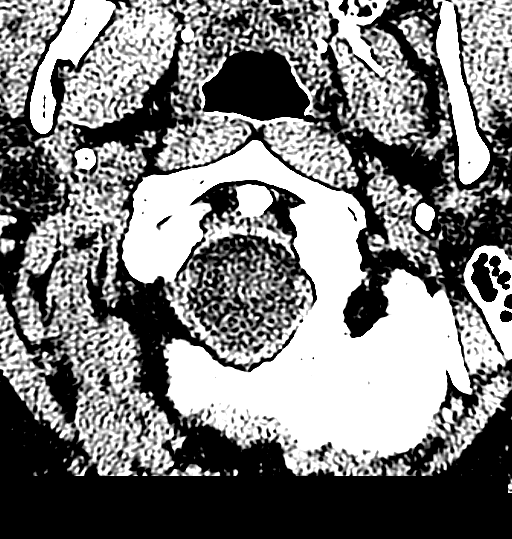

[17 of 30 positions shown; findings below may reference images not displayed]

FINDINGS: CT HEAD FINDINGS

There is no evidence of acute intracranial hemorrhage, mass lesion,
brain edema or extra-axial fluid collection. The ventricles and
subarachnoid spaces are appropriately sized for age. There is no CT
evidence of acute cortical infarction.

The visualized paranasal sinuses, mastoid air cells and middle ears
are clear. The calvarium is intact.

CT CERVICAL SPINE FINDINGS

The cervical alignment is normal. The disc spaces are preserved.
There is no evidence acute fracture or traumatic subluxation. No
acute soft tissue findings are demonstrated. There is no significant
osseous foraminal stenosis.
IMPRESSION: 1. No acute intracranial or calvarial findings.
2. Negative CT of the cervical spine.

## 2015-04-29 ENCOUNTER — Encounter: Payer: Self-pay | Admitting: Internal Medicine

## 2015-04-29 ENCOUNTER — Encounter: Payer: Self-pay | Admitting: Gastroenterology

## 2015-05-30 ENCOUNTER — Encounter: Payer: Self-pay | Admitting: Internal Medicine

## 2015-07-25 ENCOUNTER — Emergency Department (HOSPITAL_COMMUNITY)
Admission: EM | Admit: 2015-07-25 | Discharge: 2015-07-25 | Disposition: A | Discharge status: Home / Self Care | Payer: BLUE CROSS/BLUE SHIELD | Attending: Emergency Medicine | Admitting: Emergency Medicine

## 2015-07-25 ENCOUNTER — Emergency Department (HOSPITAL_COMMUNITY): Admission: EM | Admit: 2015-07-25 | Discharge: 2015-07-25 | Payer: Self-pay

## 2015-07-25 ENCOUNTER — Encounter (HOSPITAL_COMMUNITY): Payer: Self-pay | Admitting: Emergency Medicine

## 2015-07-25 DIAGNOSIS — Z23 Encounter for immunization: Secondary | ICD-10-CM | POA: Diagnosis not present

## 2015-07-25 DIAGNOSIS — S81832A Puncture wound without foreign body, left lower leg, initial encounter: Secondary | ICD-10-CM | POA: Diagnosis present

## 2015-07-25 DIAGNOSIS — Y998 Other external cause status: Secondary | ICD-10-CM | POA: Insufficient documentation

## 2015-07-25 DIAGNOSIS — K219 Gastro-esophageal reflux disease without esophagitis: Secondary | ICD-10-CM | POA: Diagnosis not present

## 2015-07-25 DIAGNOSIS — Y9289 Other specified places as the place of occurrence of the external cause: Secondary | ICD-10-CM | POA: Insufficient documentation

## 2015-07-25 DIAGNOSIS — Y9389 Activity, other specified: Secondary | ICD-10-CM | POA: Insufficient documentation

## 2015-07-25 DIAGNOSIS — Z79899 Other long term (current) drug therapy: Secondary | ICD-10-CM | POA: Insufficient documentation

## 2015-07-25 DIAGNOSIS — W540XXA Bitten by dog, initial encounter: Secondary | ICD-10-CM | POA: Insufficient documentation

## 2015-07-25 MED ORDER — RABIES IMMUNE GLOBULIN 150 UNIT/ML IM INJ
20.0000 [IU]/kg | INJECTION | Freq: Once | INTRAMUSCULAR | Status: AC
  Administered 2015-07-25: 1950 [IU] via INTRAMUSCULAR
  Filled 2015-07-25: qty 13

## 2015-07-25 MED ORDER — RABIES VACCINE, PCEC IM SUSR
1.0000 mL | Freq: Once | INTRAMUSCULAR | Status: AC
  Administered 2015-07-25: 1 mL via INTRAMUSCULAR
  Filled 2015-07-25: qty 1

## 2015-07-25 NOTE — ED Provider Notes (Signed)
CSN: [id number]     Arrival date & time 07/25/15  1306 History   FThis chart was scribed for non-physician practitioner Hyman Bible, PA-C working with Daleen Bo, MD by Hilda Lias, ED Scribe. This patient was seen in room TR11C/TR11C and the patient's care was started at 3:17 PM.  Chief Complaint  Patient presents with  . Animal Bite      The history is provided by the patient. No language interpreter was used.   HPI Comments: [name] Burns is a 44 y.o. male who presents to the Emergency Department complaining of an animal bite on his left lower leg that has been present for 5 days. 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 Pt states he didn't think much of it, but went home afterwards and used hydrogen peroxide to clean it out. Pt states he has been using Neosporin on the area since the incident occurred, and has had no symptoms. Pt states he came to ED after talking with a few people and was advised to come to ED to get everything checked out. Pt denies any other symptoms.  No drainage of the area.  No history of DM.  He states that his Tetanus is UTD.   Past Medical History  Diagnosis Date  . Allergic rhinitis     immunotherapy, skin tesst POS 04-25-09  . Esophageal stricture   . GERD (gastroesophageal reflux disease)    Past Surgical History  Procedure Laterality Date  . Wisdom tooth extraction     Family History  Problem Relation Age of Onset  . Allergic rhinitis Father   . Breast cancer Mother    Social History  Substance Use Topics  . Smoking status: Never Smoker   . Smokeless tobacco: Former Systems developer    Types: Chew    Quit date: 11/06/2007  . Alcohol Use: Yes     Comment: socially    Review of Systems  Constitutional: Negative for fever and chills.  Skin: Positive for color change and wound.      Allergies  Review of patient's allergies indicates no known  allergies.  Home Medications   Prior to Admission medications   Medication Sig Start Date End Date Taking? Authorizing Provider  atorvastatin (LIPITOR) 10 MG tablet Take 10 mg by mouth daily.    Historical Provider, MD  fexofenadine (ALLEGRA) 180 MG tablet Take 180 mg by mouth daily as needed for allergies.     Historical Provider, MD  fluticasone (FLONASE) 50 MCG/ACT nasal spray Place 2 sprays into the nose as needed. 03/08/11 06/02/14  Deneise Lever, MD  omeprazole (PRILOSEC) 20 MG capsule Take 1 capsule (20 mg total) by mouth daily. 01/05/13   Ladene Artist, MD  ondansetron (ZOFRAN ODT) 8 MG disintegrating tablet Take 1 tablet (8 mg total) by mouth every 8 (eight) hours as needed for nausea or vomiting. 05/27/14   Linton Flemings, MD   BP 137/87 mmHg  Pulse 78  Temp(Src) 98.6 F (37 C) (Oral)  Resp 16  Ht 5' 9"  (1.753 m)  Wt 218 lb (98.884 kg)  BMI 32.18 kg/m2  SpO2 98% Physical Exam  Constitutional: He is oriented to person, place, and time. He appears well-developed and well-nourished.  HENT:  Head: Normocephalic and atraumatic.  Neck: Normal range of motion.  Cardiovascular: Normal rate and regular rhythm.   Pulmonary/Chest: Effort normal and breath sounds normal.  Abdominal: He exhibits no distension.  Musculoskeletal: Normal range of motion.  Neurological: He is alert and oriented to person, place, and time.  Skin: Skin is warm and dry.  Well-healed puncture wound to left lateral lower leg.  No surrounding erythema, edema, or warmth  Psychiatric: He has a normal mood and affect.  Nursing note and vitals reviewed.   ED Course  Procedures (including critical care time)  DIAGNOSTIC STUDIES: Oxygen Saturation is 98% on room air, normal by my interpretation.    COORDINATION OF CARE: 3:22 PM Discussed treatment plan with pt at bedside and pt agreed to plan.   Labs Review Labs Reviewed - No data to display  Imaging Review No results found. I have personally reviewed and  evaluated these images and lab results as part of my medical decision-making.   EKG Interpretation None      MDM   Final diagnoses:  None   Patient presents today with a wound to the left lower leg after being bitten by a dog 5 days ago.  No signs of infection at this time.  Wound appears to be well healed.  Patient is unsure of the vaccination status of the dog.  Animal control notified.  Patient states that he would like to have the rabies series started at this time.  Patient given Rabies Immunoglobulin and Rabies Vaccine.  He reports that his Tetanus is UTD.  Patient stable for discharge.  Return precautions given.    I personally performed the services described in this documentation, which was scribed in my presence. The recorded information has been reviewed and is accurate.    Hyman Bible, PA-C 07/25/15 [place], MD 07/26/15 [phone]

## 2015-07-25 NOTE — ED Notes (Signed)
Declined W/C at D/C and was escorted to lobby by RN. 

## 2015-07-25 NOTE — ED Notes (Addendum)
Registration requested to collect information on dog bite. Pt reports he was bitten on Wed.07-20-15. Pt has not reported bite to animal control and has not confirmed if the dog has all shots. Pt has small bite mark to posterior L lower leg.

## 2015-07-25 NOTE — ED Notes (Signed)
Pt from work for eval of dog bite to left posterior leg, pt noted to have red puncture wound to leg on last wednesday. Pt denies any pain at this time. Pt states he is unsure if the dog was up to date on shots. Ambulatory at this time.

## 2015-07-25 NOTE — Discharge Instructions (Signed)
Animal Bite An animal bite can result in a scratch on the skin, deep open cut, puncture of the skin, crush injury, or tearing away of the skin or a body part. Dogs are responsible for most animal bites. Children are bitten more often than adults. An animal bite can range from very mild to more serious. A small bite from your house pet is no cause for alarm. However, some animal bites can become infected or injure a bone or other tissue. You must seek medical care if:  The skin is broken and bleeding does not slow down or stop after 15 minutes.  The puncture is deep and difficult to clean (such as a cat bite).  Pain, warmth, redness, or pus develops around the wound.  The bite is from a stray animal or rodent. There may be a risk of rabies infection.  The bite is from a snake, raccoon, skunk, fox, coyote, or bat. There may be a risk of rabies infection.  The person bitten has a chronic illness such as diabetes, liver disease, or cancer, or the person takes medicine that lowers the immune system.  There is concern about the location and severity of the bite. It is important to clean and protect an animal bite wound right away to prevent infection. Follow these steps:  Clean the wound with plenty of water and soap.  Apply an antibiotic cream.  Apply gentle pressure over the wound with a clean towel or gauze to slow or stop bleeding.  Elevate the affected area above the heart to help stop any bleeding.  Seek medical care. Getting medical care within 8 hours of the animal bite leads to the best possible outcome. DIAGNOSIS  Your caregiver will most likely:  Take a detailed history of the animal and the bite injury.  Perform a wound exam.  Take your medical history. Blood tests or X-rays may be performed. Sometimes, infected bite wounds are cultured and sent to a lab to identify the infectious bacteria.  TREATMENT  Medical treatment will depend on the location and type of animal bite as  well as the patient's medical history. Treatment may include:  Wound care, such as cleaning and flushing the wound with saline solution, bandaging, and elevating the affected area.  Antibiotics.  Tetanus immunization.  Rabies immunization.  Leaving the wound open to heal. This is often done with animal bites, due to the high risk of infection. However, in certain cases, wound closure with stitches, wound adhesive, skin adhesive strips, or staples may be used. Infected bites that are left untreated may require intravenous (IV) antibiotics and surgical treatment in the hospital. [place]  Follow your caregiver's instructions for wound care.  Take all medicines as directed.  If your caregiver prescribes antibiotics, take them as directed. Finish them even if you start to feel better.  Follow up with your caregiver for further exams or immunizations as directed. You may need a tetanus shot if:  You cannot remember when you had your last tetanus shot.  You have never had a tetanus shot.  The injury broke your skin. If you get a tetanus shot, your arm may swell, get red, and feel warm to the touch. This is common and not a problem. If you need a tetanus shot and you choose not to have one, there is a rare chance of getting tetanus. Sickness from tetanus can be serious. SEEK MEDICAL CARE IF:  You notice warmth, redness, soreness, swelling, pus discharge, or a bad  smell coming from the wound.  You have a red line on the skin coming from the wound.  You have a fever, chills, or a general ill feeling.  You have nausea or vomiting.  You have continued or worsening pain.  You have trouble moving the injured part.  You have other questions or concerns. MAKE SURE YOU:  Understand these instructions.  Will watch your condition.  Will get help right away if you are not doing well or get worse. Document Released: 07/10/2011 Document Revised: 01/14/2012 Document  Reviewed: 07/10/2011 [facility] Patient Information 2015 [place], [place]. This information is not intended to replace advice given to you by your health care provider. Make sure you discuss any questions you have with your health care provider.

## 2015-07-28 ENCOUNTER — Emergency Department (INDEPENDENT_AMBULATORY_CARE_PROVIDER_SITE_OTHER)
Admission: EM | Admit: 2015-07-28 | Discharge: 2015-07-28 | Disposition: A | Discharge status: Home / Self Care | Payer: BLUE CROSS/BLUE SHIELD | Source: Home / Self Care

## 2015-07-28 ENCOUNTER — Encounter (HOSPITAL_COMMUNITY): Payer: Self-pay

## 2015-07-28 DIAGNOSIS — Z203 Contact with and (suspected) exposure to rabies: Secondary | ICD-10-CM

## 2015-07-28 MED ORDER — RABIES VACCINE, PCEC IM SUSR
INTRAMUSCULAR | Status: AC
  Filled 2015-07-28: qty 1

## 2015-07-28 MED ORDER — RABIES VACCINE, PCEC IM SUSR
1.0000 mL | Freq: Once | INTRAMUSCULAR | Status: AC
Start: 2015-07-28 — End: 2015-07-28
  Administered 2015-07-28: 1 mL via INTRAMUSCULAR

## 2015-07-28 NOTE — ED Notes (Signed)
Here for shot #2, day #3 in series . No complaints

## 2015-08-01 ENCOUNTER — Encounter (HOSPITAL_COMMUNITY): Payer: Self-pay

## 2015-08-01 ENCOUNTER — Emergency Department (INDEPENDENT_AMBULATORY_CARE_PROVIDER_SITE_OTHER)
Admission: EM | Admit: 2015-08-01 | Discharge: 2015-08-01 | Disposition: A | Discharge status: Home / Self Care | Payer: BLUE CROSS/BLUE SHIELD | Source: Home / Self Care

## 2015-08-01 DIAGNOSIS — Z203 Contact with and (suspected) exposure to rabies: Secondary | ICD-10-CM

## 2015-08-01 MED ORDER — RABIES VACCINE, PCEC IM SUSR
1.0000 mL | Freq: Once | INTRAMUSCULAR | Status: AC
  Administered 2015-08-01: 1 mL via INTRAMUSCULAR

## 2015-08-01 MED ORDER — RABIES VACCINE, PCEC IM SUSR
INTRAMUSCULAR | Status: AC
  Filled 2015-08-01: qty 1

## 2015-08-01 NOTE — ED Notes (Signed)
Here for day #7, shot #3 rabies series. Denies complaints

## 2015-08-08 ENCOUNTER — Encounter (HOSPITAL_COMMUNITY): Payer: Self-pay

## 2015-08-08 ENCOUNTER — Emergency Department (INDEPENDENT_AMBULATORY_CARE_PROVIDER_SITE_OTHER)
Admission: EM | Admit: 2015-08-08 | Discharge: 2015-08-08 | Disposition: A | Discharge status: Home / Self Care | Payer: BLUE CROSS/BLUE SHIELD | Source: Home / Self Care

## 2015-08-08 DIAGNOSIS — Z203 Contact with and (suspected) exposure to rabies: Secondary | ICD-10-CM

## 2015-08-08 MED ORDER — RABIES VACCINE, PCEC IM SUSR
1.0000 mL | Freq: Once | INTRAMUSCULAR | Status: AC
  Administered 2015-08-08: 1 mL via INTRAMUSCULAR

## 2015-08-08 NOTE — ED Notes (Signed)
Here for day #14,  Final shot in series. Denies problems

## 2019-09-08 ENCOUNTER — Other Ambulatory Visit: Payer: Self-pay

## 2019-09-08 DIAGNOSIS — Z20822 Contact with and (suspected) exposure to covid-19: Secondary | ICD-10-CM

## 2019-09-09 LAB — NOVEL CORONAVIRUS, NAA: SARS-CoV-2, NAA: NOT DETECTED

## 2022-11-20 ENCOUNTER — Encounter: Payer: Self-pay | Admitting: Gastroenterology
